# Patient Record
Sex: Female | Born: 1975
Health system: Southern US, Community
[De-identification: ages and names within clinical notes are randomized; demographics above are authoritative.]

## PROBLEM LIST (undated history)

## (undated) DIAGNOSIS — K219 Gastro-esophageal reflux disease without esophagitis: Secondary | ICD-10-CM

## (undated) DIAGNOSIS — J45909 Unspecified asthma, uncomplicated: Secondary | ICD-10-CM

## (undated) DIAGNOSIS — B009 Herpesviral infection, unspecified: Secondary | ICD-10-CM

## (undated) DIAGNOSIS — M549 Dorsalgia, unspecified: Secondary | ICD-10-CM

## (undated) DIAGNOSIS — R51 Headache: Secondary | ICD-10-CM

## (undated) HISTORY — DX: Gastro-esophageal reflux disease without esophagitis: K21.9

## (undated) HISTORY — DX: Headache: R51

## (undated) HISTORY — DX: Unspecified asthma, uncomplicated: J45.909

## (undated) HISTORY — DX: Dorsalgia, unspecified: M54.9

## (undated) HISTORY — DX: Herpesviral infection, unspecified: B00.9

---

## 1998-10-26 ENCOUNTER — Other Ambulatory Visit: Admission: RE | Admit: 1998-10-26 | Discharge: 1998-10-26 | Payer: Self-pay | Admitting: Internal Medicine

## 1999-03-20 ENCOUNTER — Inpatient Hospital Stay (HOSPITAL_COMMUNITY): Admission: AD | Admit: 1999-03-20 | Discharge: 1999-03-26 | Payer: Self-pay | Admitting: *Deleted

## 1999-03-20 ENCOUNTER — Encounter: Payer: Self-pay | Admitting: *Deleted

## 1999-03-28 ENCOUNTER — Emergency Department (HOSPITAL_COMMUNITY): Admission: EM | Admit: 1999-03-28 | Discharge: 1999-03-28 | Payer: Self-pay | Admitting: Emergency Medicine

## 1999-04-05 ENCOUNTER — Encounter: Admission: RE | Admit: 1999-04-05 | Discharge: 1999-04-05 | Payer: Self-pay | Admitting: Obstetrics

## 1999-04-19 ENCOUNTER — Encounter: Admission: RE | Admit: 1999-04-19 | Discharge: 1999-04-19 | Payer: Self-pay | Admitting: Obstetrics

## 1999-04-26 ENCOUNTER — Encounter: Payer: Self-pay | Admitting: Obstetrics

## 1999-04-26 ENCOUNTER — Ambulatory Visit (HOSPITAL_COMMUNITY): Admission: RE | Admit: 1999-04-26 | Discharge: 1999-04-26 | Payer: Self-pay | Admitting: Obstetrics

## 1999-05-31 ENCOUNTER — Encounter: Admission: RE | Admit: 1999-05-31 | Discharge: 1999-05-31 | Payer: Self-pay | Admitting: Obstetrics

## 1999-07-02 ENCOUNTER — Encounter: Payer: Self-pay | Admitting: Obstetrics

## 1999-07-02 ENCOUNTER — Ambulatory Visit (HOSPITAL_COMMUNITY): Admission: RE | Admit: 1999-07-02 | Discharge: 1999-07-02 | Payer: Self-pay | Admitting: Internal Medicine

## 1999-12-17 ENCOUNTER — Other Ambulatory Visit: Admission: RE | Admit: 1999-12-17 | Discharge: 1999-12-17 | Payer: Self-pay | Admitting: Obstetrics and Gynecology

## 2000-03-06 ENCOUNTER — Encounter: Payer: Self-pay | Admitting: Obstetrics and Gynecology

## 2000-03-06 ENCOUNTER — Ambulatory Visit (HOSPITAL_COMMUNITY): Admission: RE | Admit: 2000-03-06 | Discharge: 2000-03-06 | Payer: Self-pay | Admitting: Obstetrics and Gynecology

## 2000-06-07 ENCOUNTER — Inpatient Hospital Stay (HOSPITAL_COMMUNITY): Admission: AD | Admit: 2000-06-07 | Discharge: 2000-06-07 | Payer: Self-pay | Admitting: Obstetrics and Gynecology

## 2000-08-03 ENCOUNTER — Inpatient Hospital Stay (HOSPITAL_COMMUNITY): Admission: AD | Admit: 2000-08-03 | Discharge: 2000-08-06 | Payer: Self-pay | Admitting: Obstetrics and Gynecology

## 2000-08-03 ENCOUNTER — Inpatient Hospital Stay (HOSPITAL_COMMUNITY): Admission: AD | Admit: 2000-08-03 | Discharge: 2000-08-03 | Payer: Self-pay | Admitting: Obstetrics and Gynecology

## 2000-08-09 ENCOUNTER — Encounter: Admission: RE | Admit: 2000-08-09 | Discharge: 2000-09-08 | Payer: Self-pay | Admitting: Obstetrics and Gynecology

## 2000-10-18 ENCOUNTER — Inpatient Hospital Stay (HOSPITAL_COMMUNITY): Admission: AD | Admit: 2000-10-18 | Discharge: 2000-10-18 | Payer: Self-pay | Admitting: Obstetrics and Gynecology

## 2000-12-31 ENCOUNTER — Other Ambulatory Visit: Admission: RE | Admit: 2000-12-31 | Discharge: 2000-12-31 | Payer: Self-pay | Admitting: Obstetrics and Gynecology

## 2005-09-26 ENCOUNTER — Emergency Department (HOSPITAL_COMMUNITY): Admission: EM | Admit: 2005-09-26 | Discharge: 2005-09-26 | Payer: Self-pay | Admitting: Emergency Medicine

## 2008-03-28 ENCOUNTER — Inpatient Hospital Stay (HOSPITAL_COMMUNITY): Admission: AD | Admit: 2008-03-28 | Discharge: 2008-03-28 | Payer: Self-pay | Admitting: Obstetrics & Gynecology

## 2008-04-06 ENCOUNTER — Inpatient Hospital Stay (HOSPITAL_COMMUNITY): Admission: AD | Admit: 2008-04-06 | Discharge: 2008-04-06 | Payer: Self-pay | Admitting: Obstetrics and Gynecology

## 2008-04-22 ENCOUNTER — Inpatient Hospital Stay (HOSPITAL_COMMUNITY): Admission: AD | Admit: 2008-04-22 | Discharge: 2008-04-22 | Payer: Self-pay | Admitting: Obstetrics & Gynecology

## 2008-04-23 ENCOUNTER — Inpatient Hospital Stay (HOSPITAL_COMMUNITY): Admission: AD | Admit: 2008-04-23 | Discharge: 2008-04-25 | Payer: Self-pay | Admitting: Obstetrics & Gynecology

## 2008-07-09 ENCOUNTER — Emergency Department (HOSPITAL_COMMUNITY): Admission: EM | Admit: 2008-07-09 | Discharge: 2008-07-09 | Payer: Self-pay | Admitting: Family Medicine

## 2010-07-03 LAB — CBC
HCT: 34.4 % — ABNORMAL LOW (ref 36.0–46.0)
Hemoglobin: 11.7 g/dL — ABNORMAL LOW (ref 12.0–15.0)
MCHC: 34 g/dL (ref 30.0–36.0)
MCHC: 34 g/dL (ref 30.0–36.0)
MCV: 90.6 fL (ref 78.0–100.0)
MCV: 91.8 fL (ref 78.0–100.0)
Platelets: 214 10*3/uL (ref 150–400)
Platelets: 245 10*3/uL (ref 150–400)
RBC: 3.79 MIL/uL — ABNORMAL LOW (ref 3.87–5.11)
RDW: 13.3 % (ref 11.5–15.5)
RDW: 13.7 % (ref 11.5–15.5)
WBC: 10.6 10*3/uL — ABNORMAL HIGH (ref 4.0–10.5)

## 2010-07-31 NOTE — H&P (Signed)
Tracie Riley, PALOMAREZ               ACCOUNT NO.:  [id number]   MEDICAL RECORD NO.:  [id number]          PATIENT TYPE:  MAT   LOCATION:  [facility]                          FACILITY:  [facility]   PHYSICIAN:  Lenoard Aden, M.D.DATE OF BIRTH:  Sep 17, 1975   DATE OF ADMISSION:  03/28/2008  DATE OF DISCHARGE:  03/28/2008                              HISTORY & PHYSICAL   CHIEF COMPLAINT:  Persistent upper respiratory infection.   HISTORY OF PRESENT ILLNESS:  A 35 year old white female G3, P1 at 31-  plus weeks gestation with persistent upper respiratory infection status  post Zithromax.  She denies productive cough or fevers.   ALLERGIES:  She has no known drug allergies.   MEDICATIONS:  Prenatal vitamins and finishing Zithromax.   FAMILY HISTORY:  Hypertension, pancreatic cancer, stroke, kidney  disease, rheumatoid arthritis, bipolar disorder, and pelvic disease.   SOCIAL HISTORY:  She is a light smoker.  She denies domestic or physical  violence.  She has history of one STD and one-term pregnancy with  vaginal delivery.   No other medical or surgical hospitalization.   PHYSICAL EXAMINATION:  GENERAL:  She is a well-developed, well-nourished  white female in no acute distress.  HEENT:  Normal.  LUNGS:  Clear.  HEART:  Regular rate and rhythm.  ABDOMEN:  Soft, gravid, and nontender.  No CVA tenderness.  PELVIC:  Deferred.  EXTREMITIES:  There are no cords.  NEUROLOGIC:  Nonfocal.  SKIN:  Intact.   Chest x-ray is negative.   IMPRESSION:  Upper respiratory infection.  No evidence of progressive  infection.  No evidence of bronchitis or pneumonia.   PLAN:  Discharged home.  Albuterol inhaler is given.  Follow up in the  office as scheduled.      Lenoard Aden, M.D.  Electronically Signed     RJT/MEDQ  D:  03/28/2008  T:  03/29/2008  Job:  [id number]

## 2010-08-03 NOTE — Discharge Summary (Signed)
[facility]  Patient:    Tracie Riley, Tracie Riley                      MRN: [id number] Adm. Date:  [id number] Disc. Date: [id number] Attending:  Michaele Offer                           Discharge Summary  DISCHARGE DIAGNOSES:          1. Intrauterine pregnancy at 39 weeks.                               2. Status post normal spontaneous vaginal                                  delivery.  DISCHARGE MEDICATIONS:        1. Percocet 1-2 tablets p.o. q.4h. p.r.n.                               2. Motrin 600 mg p.o. q.6h.  DISCHARGE FOLLOW-UP:          The patient is to follow up in six weeks for her routine postpartum exam.  HOSPITAL COURSE:              The patient is a 35 year old female, G2, P3-0-0-1-0 who is admitted at 39+ weeks with the complaint of contractions every five minutes.  Her prenatal care had been essentially uncomplicated, just some mild anemia which responded to _____, and carpal tunnel syndrome. On admission, she was 3 cm dilated, 90% effaced, and -1 station.  Prenatal laboratories were as follows:  O-positive, antibody negative, RPR nonreactive, Rubella equivocal, hepatitis B surface antigen negative, HIV negative, GC negative, chlamydia negative, GBS negative.  PAST OBSTETRICAL HISTORY:     In 1996, she had a miscarriage.  PAST GYNECOLOGIC HISTORY:     In 2001, she had a history of PID.  She also had a history of herpes.  However, she had no outbreaks in this pregnancy.  PAST MEDICAL HISTORY:         She had been diagnosed in the past with ADHD and had history of depression.  PAST SURGICAL HISTORY:        None.  MEDICATIONS:                  None.  ALLERGIES:                    She had no known drug allergies.  SOCIAL HISTORY:               She is single with the father of the baby present and involved.  She had a history of cocaine and marijuana use prior to pregnancy.  However, she had very minimal use of tobacco during the  pregnancy.  PHYSICAL EXAMINATION:  VITAL SIGNS:                  On admission, she was afebrile with stable vital signs.  Fetal heart rate was reactive.  She was thus admitted and progressed on her own over the next several hours to 9 cm, complete effacement, and +1 station.  She was vertex with a bulging bag of water.  Approximately two  hours later, the patient was found to be 10 cm and +2 station with, again, a bulging bag of water.  She was allowed to begin pushing and had rupture of membranes with her first push.  She then pushed for approximately two hours with some resting and had a normal spontaneous vaginal delivery of a vigorous female infant over a secondary laceration.  Apgars were 9 and 9.  Weight was 7 pounds and 11 ounces.  The placenta delivered spontaneously and second degree laceration was repaired with 2-0 Vicryl.  Cervix and rectum were intact.  She was then admitted for routine postpartum care and was doing very well on postpartum day #2.  She was afebrile with stable vital signs.  She was breast-feeding and doing well with that.  Social work had met with the patient and discussed risk factors for postpartum depression as well as giving her information for support after her hospitalization.  Therefore, the patient was felt stable for discharge and was discharged to home to follow up in approximately six weeks for her routine postpartum exam. DD:  08/06/00 TD:  08/06/00 Job: [id number]

## 2012-03-04 ENCOUNTER — Emergency Department (HOSPITAL_COMMUNITY)
Admission: EM | Admit: 2012-03-04 | Discharge: 2012-03-04 | Disposition: A | Discharge status: Home / Self Care | Payer: Self-pay | Attending: Emergency Medicine | Admitting: Emergency Medicine

## 2012-03-04 ENCOUNTER — Encounter (HOSPITAL_COMMUNITY): Payer: Self-pay | Admitting: Cardiology

## 2012-03-04 DIAGNOSIS — Z79899 Other long term (current) drug therapy: Secondary | ICD-10-CM | POA: Insufficient documentation

## 2012-03-04 DIAGNOSIS — R21 Rash and other nonspecific skin eruption: Secondary | ICD-10-CM | POA: Insufficient documentation

## 2012-03-04 DIAGNOSIS — L509 Urticaria, unspecified: Secondary | ICD-10-CM | POA: Insufficient documentation

## 2012-03-04 DIAGNOSIS — T7840XA Allergy, unspecified, initial encounter: Secondary | ICD-10-CM

## 2012-03-04 DIAGNOSIS — R220 Localized swelling, mass and lump, head: Secondary | ICD-10-CM | POA: Insufficient documentation

## 2012-03-04 DIAGNOSIS — F17200 Nicotine dependence, unspecified, uncomplicated: Secondary | ICD-10-CM | POA: Insufficient documentation

## 2012-03-04 MED ORDER — METHYLPREDNISOLONE SODIUM SUCC 125 MG IJ SOLR
125.0000 mg | Freq: Once | INTRAMUSCULAR | Status: AC
  Administered 2012-03-04: 125 mg via INTRAVENOUS
  Filled 2012-03-04: qty 2

## 2012-03-04 MED ORDER — DIPHENHYDRAMINE HCL 50 MG/ML IJ SOLN
25.0000 mg | Freq: Once | INTRAMUSCULAR | Status: AC
  Administered 2012-03-04: 25 mg via INTRAVENOUS
  Filled 2012-03-04: qty 1

## 2012-03-04 MED ORDER — PREDNISONE 20 MG PO TABS: 60.0000 mg | ORAL_TABLET | Freq: Every day | ORAL | Status: AC

## 2012-03-04 MED ORDER — FAMOTIDINE IN NACL 20-0.9 MG/50ML-% IV SOLN
20.0000 mg | Freq: Once | INTRAVENOUS | Status: AC
  Administered 2012-03-04: 20 mg via INTRAVENOUS
  Filled 2012-03-04: qty 50

## 2012-03-04 NOTE — ED Notes (Signed)
Pt reports hives on her arms, neck and chest that started yesterday. Denies using any new medications or foods. Airway intact.

## 2012-03-04 NOTE — ED Provider Notes (Signed)
History     CSN: [id number]  Arrival date & time 03/04/12  4098   First MD Initiated Contact with Patient 03/04/12 [phone]      Chief Complaint  Patient presents with  . Allergic Reaction    (Consider location/radiation/quality/duration/timing/severity/associated sxs/prior treatment) HPI Tracie Riley is a 36 y.o. female who presents with complaint of possible allergic reaction. States yesterday around 2pm after she woke up from a nap, began itching, saw some red rash over her neck. States rash quickly spread, and by evening time was all over her body. States it subsided and again worsened this morning. Pt did not take any medication. Rash is all over, itchy, hives. States currently her lips feel "funny." Denies swelling of the throat or difficulty breathing. Stats no new products or foods that she can think of. Hx of similar reaction, states was much worse last time, received epi, never figured out what she was allergic to.   History reviewed. No pertinent past medical history.  History reviewed. No pertinent past surgical history.  History reviewed. No pertinent family history.  History  Substance Use Topics  . Smoking status: Current Every Day Smoker  . Smokeless tobacco: Not on file  . Alcohol Use: Yes    OB History    Grav Para Term Preterm Abortions TAB SAB Ect Mult Living                  Review of Systems  HENT: Positive for facial swelling.   Respiratory: Negative for shortness of breath.   Skin: Positive for rash.  All other systems reviewed and are negative.    Allergies  Review of patient's allergies indicates no known allergies.  Home Medications   Current Outpatient Rx  Name  Route  Sig  Dispense  Refill  . BEE POLLEN PO   Oral   Take 1 capsule by mouth daily.         Marland Kitchen GARCINIA CAMBOGIA-CHROMIUM PO   Oral   Take 1 capsule by mouth daily.         Marland Kitchen HYDROCORTISONE 1 % EX CREA   Topical   Apply 1 application topically 2 (two) times daily  as needed. For itching           BP 128/86  Pulse 110  Temp 97.5 F (36.4 C) (Oral)  Resp 20  SpO2 100%  Physical Exam  Nursing note and vitals reviewed. Constitutional: She is oriented to person, place, and time. She appears well-developed and well-nourished. No distress.  HENT:       No rash over oral mucosa. Lips appear normal, tongue normal, uvula normal  Eyes: Conjunctivae normal are normal.  Neck: Neck supple.  Cardiovascular: Normal rate, regular rhythm and normal heart sounds.   Pulmonary/Chest: Effort normal and breath sounds normal. No respiratory distress. She has no wheezes. She has no rales.       No stridor  Abdominal: Soft. Bowel sounds are normal. She exhibits no distension. There is no tenderness. There is no rebound.  Musculoskeletal: She exhibits no edema.  Neurological: She is alert and oriented to person, place, and time.  Skin:       Diffuse hives all over the body, including scalp, hands, arms, back, abdomen, neck, legs.   Psychiatric: She has a normal mood and affect.    ED Course  Procedures (including critical care time)  9:45 AM Pt seen and examined. Pt with diffuse hives and feels like lips swelling. Normal lips, tongue,  uvula on exam. No stridor. Will start IV, solumedrol, pepcid, benadryl, will monitor.   11:06 AM PT reassessed. Rash still there, states itching is not as bad. No worsening in lip swelling, no difficulty breathing. Will continue to monitor. Pt moved to CDU   1. Allergic reaction   2. Hives       MDM         Lottie Mussel, PA 03/04/12 1652

## 2012-03-04 NOTE — ED Provider Notes (Signed)
Moved to CDU to observe after trx for hives. She is feeling improved, no rash now. Denies SOB or throat swelling now or at symptom onset. Stable for discharge home.  Rodena Medin, PA-C 03/04/12 1243

## 2012-03-05 NOTE — ED Provider Notes (Signed)
Medical screening examination/treatment/procedure(s) were performed by non-physician practitioner and as supervising physician I was immediately available for consultation/collaboration.  Lc Joynt L Rilen Shukla, MD 03/05/12 0825 

## 2012-03-05 NOTE — ED Provider Notes (Signed)
Medical screening examination/treatment/procedure(s) were performed by non-physician practitioner and as supervising physician I was immediately available for consultation/collaboration.  Flint Melter, MD 03/05/12 [phone]

## 2013-01-07 ENCOUNTER — Encounter: Payer: Self-pay | Admitting: Obstetrics & Gynecology

## 2013-01-07 ENCOUNTER — Ambulatory Visit (INDEPENDENT_AMBULATORY_CARE_PROVIDER_SITE_OTHER): Payer: Self-pay | Admitting: Obstetrics & Gynecology

## 2013-01-07 VITALS — BP 114/65 | HR 84 | Temp 97.5°F | Ht 67.0 in | Wt 193.4 lb

## 2013-01-07 DIAGNOSIS — N951 Menopausal and female climacteric states: Secondary | ICD-10-CM

## 2013-01-07 DIAGNOSIS — Z30431 Encounter for routine checking of intrauterine contraceptive device: Secondary | ICD-10-CM

## 2013-01-07 DIAGNOSIS — Z01419 Encounter for gynecological examination (general) (routine) without abnormal findings: Secondary | ICD-10-CM

## 2013-01-07 NOTE — Progress Notes (Signed)
Patient ID: Tracie Riley, female   DOB: 1975-09-14, 37 y.o.   MRN: [id number]  Chief Complaint  Patient presents with  . Gynecologic Exam  . IUD check    HPI Tracie Riley is a 37 y.o. female.  No LMP recorded. Patient is not currently having periods (Reason: IUD). [id number] Mirena for 4 yr and 8 months. Occasional spotting, has VMS HPI  History reviewed. No pertinent past medical history.  History reviewed. No pertinent past surgical history. D&C for miscarriage Family History  Problem Relation Age of Onset  . Rheum arthritis Mother   . Hepatitis C Mother    Early menopause 27 yr, mother Social History History  Substance Use Topics  . Smoking status: Current Every Day Smoker -- 0.50 packs/day    Types: Cigarettes  . Smokeless tobacco: Never Used  . Alcohol Use: Yes     Comment: occasional    No Known Allergies  No current outpatient prescriptions on file.   No current facility-administered medications for this visit.    Review of Systems Review of Systems  Constitutional: Negative for fatigue.       Hot flushes  Genitourinary: Positive for dyspareunia. Negative for vaginal bleeding, vaginal discharge and menstrual problem.  Psychiatric/Behavioral:       Decreased libido    Blood pressure 114/65, pulse 84, temperature 97.5 F (36.4 C), height 5\' 7"  (1.702 m), weight 193 lb 6.4 oz (87.726 kg).  Physical Exam Physical Exam  Constitutional: She is oriented to person, place, and time. She appears well-developed. No distress.  Pulmonary/Chest: Effort normal. No respiratory distress.  Breasts without mass or tenderness  Abdominal: Soft. Bowel sounds are normal. There is no tenderness.  Genitourinary: Vagina normal and uterus normal. No vaginal discharge found.  Pap doneStrings visible no mass  Neurological: She is alert and oriented to person, place, and time.  Skin: Skin is warm and dry.  Psychiatric: She has a normal mood and affect. Her behavior is normal.     Data Reviewed   Assessment    Possible perimenopause, IUD in place     Plan    [facility], consider E2. RTC.        Kamarrion Stfort 01/07/2013, 1:32 PM

## 2013-01-07 NOTE — Patient Instructions (Signed)
Levonorgestrel intrauterine device (IUD) What is this medicine? LEVONORGESTREL IUD (LEE voe nor jes trel) is a contraceptive (birth control) device. The device is placed inside the uterus by a healthcare professional. It is used to prevent pregnancy and can also be used to treat heavy bleeding that occurs during your period. Depending on the device, it can be used for 3 to 5 years. This medicine may be used for other purposes; ask your health care provider or pharmacist if you have questions. What should I tell my health care provider before I take this medicine? They need to know if you have any of these conditions: -abnormal Pap smear -cancer of the breast, uterus, or cervix -diabetes -endometritis -genital or pelvic infection now or in the past -have more than one sexual partner or your partner has more than one partner -heart disease -history of an ectopic or tubal pregnancy -immune system problems -IUD in place -liver disease or tumor -problems with blood clots or take blood-thinners -use intravenous drugs -uterus of unusual shape -vaginal bleeding that has not been explained -an unusual or allergic reaction to levonorgestrel, other hormones, silicone, or polyethylene, medicines, foods, dyes, or preservatives -pregnant or trying to get pregnant -breast-feeding How should I use this medicine? This device is placed inside the uterus by a health care professional. Talk to your pediatrician regarding the use of this medicine in children. Special care may be needed. Overdosage: If you think you have taken too much of this medicine contact a poison control center or emergency room at once. NOTE: This medicine is only for you. Do not share this medicine with others. What if I miss a dose? This does not apply. What may interact with this medicine? Do not take this medicine with any of the following medications: -amprenavir -bosentan -fosamprenavir This medicine may also interact with  the following medications: -aprepitant -barbiturate medicines for inducing sleep or treating seizures -bexarotene -griseofulvin -medicines to treat seizures like carbamazepine, ethotoin, felbamate, oxcarbazepine, phenytoin, topiramate -modafinil -pioglitazone -rifabutin -rifampin -rifapentine -some medicines to treat HIV infection like atazanavir, indinavir, lopinavir, nelfinavir, tipranavir, ritonavir -St. John's wort -warfarin This list may not describe all possible interactions. Give your health care provider a list of all the medicines, herbs, non-prescription drugs, or dietary supplements you use. Also tell them if you smoke, drink alcohol, or use illegal drugs. Some items may interact with your medicine. What should I watch for while using this medicine? Visit your doctor or health care professional for regular check ups. See your doctor if you or your partner has sexual contact with others, becomes HIV positive, or gets a sexual transmitted disease. This product does not protect you against HIV infection (AIDS) or other sexually transmitted diseases. You can check the placement of the IUD yourself by reaching up to the top of your vagina with clean fingers to feel the threads. Do not pull on the threads. It is a good habit to check placement after each menstrual period. Call your doctor right away if you feel more of the IUD than just the threads or if you cannot feel the threads at all. The IUD may come out by itself. You may become pregnant if the device comes out. If you notice that the IUD has come out use a backup birth control method like condoms and call your health care provider. Using tampons will not change the position of the IUD and are okay to use during your period. What side effects may I notice from receiving this medicine?   Side effects that you should report to your doctor or health care professional as soon as possible: -allergic reactions like skin rash, itching or  hives, swelling of the face, lips, or tongue -fever, flu-like symptoms -genital sores -high blood pressure -no menstrual period for 6 weeks during use -pain, swelling, warmth in the leg -pelvic pain or tenderness -severe or sudden headache -signs of pregnancy -stomach cramping -sudden shortness of breath -trouble with balance, talking, or walking -unusual vaginal bleeding, discharge -yellowing of the eyes or skin Side effects that usually do not require medical attention (report to your doctor or health care professional if they continue or are bothersome): -acne -breast pain -change in sex drive or performance -changes in weight -cramping, dizziness, or faintness while the device is being inserted -headache -irregular menstrual bleeding within first 3 to 6 months of use -nausea This list may not describe all possible side effects. Call your doctor for medical advice about side effects. You may report side effects to FDA at 1-800-FDA-1088. Where should I keep my medicine? This does not apply. NOTE: This sheet is a summary. It may not cover all possible information. If you have questions about this medicine, talk to your doctor, pharmacist, or health care provider.  2013, Elsevier/Gold Standard. (04/04/2011 1:54:04 PM)  

## 2013-01-08 LAB — FOLLICLE STIMULATING HORMONE: FSH: 3.3 m[IU]/mL

## 2014-01-17 ENCOUNTER — Encounter: Payer: Self-pay | Admitting: Obstetrics & Gynecology

## 2014-05-19 ENCOUNTER — Telehealth: Payer: Self-pay | Admitting: Family Medicine

## 2014-05-19 ENCOUNTER — Ambulatory Visit (INDEPENDENT_AMBULATORY_CARE_PROVIDER_SITE_OTHER): Payer: BLUE CROSS/BLUE SHIELD | Admitting: Family Medicine

## 2014-05-19 ENCOUNTER — Encounter: Payer: Self-pay | Admitting: Family Medicine

## 2014-05-19 VITALS — BP 102/70 | HR 80 | Temp 98.3°F | Ht 66.75 in | Wt 205.1 lb

## 2014-05-19 DIAGNOSIS — R51 Headache: Secondary | ICD-10-CM

## 2014-05-19 DIAGNOSIS — Z716 Tobacco abuse counseling: Secondary | ICD-10-CM

## 2014-05-19 DIAGNOSIS — E669 Obesity, unspecified: Secondary | ICD-10-CM | POA: Insufficient documentation

## 2014-05-19 DIAGNOSIS — Z720 Tobacco use: Secondary | ICD-10-CM

## 2014-05-19 DIAGNOSIS — F17200 Nicotine dependence, unspecified, uncomplicated: Secondary | ICD-10-CM

## 2014-05-19 DIAGNOSIS — M549 Dorsalgia, unspecified: Secondary | ICD-10-CM

## 2014-05-19 DIAGNOSIS — R519 Headache, unspecified: Secondary | ICD-10-CM | POA: Insufficient documentation

## 2014-05-19 DIAGNOSIS — Z23 Encounter for immunization: Secondary | ICD-10-CM | POA: Diagnosis not present

## 2014-05-19 HISTORY — DX: Headache, unspecified: R51.9

## 2014-05-19 HISTORY — DX: Dorsalgia, unspecified: M54.9

## 2014-05-19 NOTE — Progress Notes (Signed)
HPI:  Tracie Riley is here to establish care. She wants to get regular health maintenance exam set up. Last PCP and physical: Sees Dr. Dellis Filbert in [place] for routine yearly physicals and mirena.   Has the following chronic problems that require follow up and concerns today:  Chronic Neck and Back Pain: -for many years -sees ortho and has had eval for rheumatological disease  -intermittent flares of R low back pain that last for a few days - flare for last 5 days, moderate pain in R low back worse with certain movements -cause by house cleaning and home improvement -she wants to start getting in shape -denies: weakness, numbness, bowel or bladder incontinence, malaise, weight loss, fevers -hx of CTS and has braces for this but does not   Tobacco Use: -1/2 ppd -working on cutting back -does not like that it stinks and the task of doing it -but craves cigarettes -reports is not ready to quit  ROS negative for unless reported above: fevers, unintentional weight loss, hearing or vision loss, chest pain, palpitations, struggling to breath, hemoptysis, melena, hematochezia, hematuria, falls, loc, si, thoughts of self harm  Past Medical History  Diagnosis Date  . Asthma   . Back pain  - chornic neck and back pain, sees ortho ([facility]), had neg rheum workup per her report 05/19/2014  . Frequent headaches 05/19/2014    History reviewed. No pertinent past surgical history.  Family History  Problem Relation Age of Onset  . Rheum arthritis Mother   . Hepatitis C Mother   . Alcohol abuse Mother   . Alcohol abuse Father     deceased    History   Social History  . Marital Status: Single    Spouse Name: N/A  . Number of Children: N/A  . Years of Education: N/A   Social History Main Topics  . Smoking status: Current Every Day Smoker -- 0.50 packs/day    Types: Cigarettes  . Smokeless tobacco: Never Used  . Alcohol Use: Yes     Comment: occasional  . Drug Use: No  . Sexual  Activity: Yes    Birth Control/ Protection: IUD   Other Topics Concern  . 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  BEE POLLEN PO, Take by mouth., Disp: , Rfl:  .  levonorgestrel (MIRENA) 20 MCG/24HR IUD, 1 each by Intrauterine route once., Disp: , Rfl:  .  Vitamin D, Ergocalciferol, (DRISDOL) 50000 UNITS CAPS capsule, , Disp: , Rfl: 0  EXAM:  Filed Vitals:   05/19/14 1122  BP: 102/70  Pulse: 80  Temp: 98.3 F (36.8 C)    Body mass index is 32.38 kg/(m^2).  GENERAL: vitals reviewed and listed above, alert, oriented, appears well hydrated and in no acute distress  HEENT: atraumatic, conjunttiva clear, no obvious abnormalities on inspection of external nose and ears  NECK: no obvious masses on inspection  LUNGS: clear to auscultation bilaterally, no wheezes, rales or rhonchi, good air movement  CV: HRRR, no peripheral edema  MS: moves all extremities without noticeable abnormality Normal  Gait Normal inspection of back and neck, no obvious scoliosis or leg length descrepancy No bony TTP Soft tissue TTP at: R QL muscle and bilat trap muscles -/+ tests: neg trendelenburg,-facet loading, -SLRT, -CLRT, -FABER, -FADIR Normal muscle strength, sensation to light touch and DTRs in LEs and UE bilaterally Noraml ROM of head and neck, normal cap refill and distal pulses  PSYCH: pleasant and cooperative, no obvious depression or anxiety  ASSESSMENT AND PLAN:  Discussed the following assessment and plan:  Bilateral back pain, unspecified location - likely musculoskeletal given chronicity and intermittent nature, posture at work likely contributing -oped for trial  HEP and advised to follow up with ortho or me if not improving/resolving in 1 month  Frequent headaches  Tobacco use disorder -counseled for >3<10 minutes, advised of significant risks associated with smokgin, advised to quit and offered help - quit line info provided as she is not ready to quit now  Obesity -lifestyle recs - healthy diet and regular exercise  -We reviewed the PMH, PSH, FH, SH, Meds and Allergies. -We provided refills for any medications we will prescribe as needed. -We addressed current concerns per orders and patient instructions. -We have asked for records for pertinent exams, studies, vaccines and notes from previous providers. -We have advised patient to follow up per instructions below.   -Patient advised to return or notify a doctor immediately if symptoms worsen or persist or new concerns arise.  Patient Instructions  BEFORE YOU LEAVE: -Tdap  -schedule physical in 3 months - come fasting that day and we will do basic labs -low back and neck exercises  We recommend the following healthy lifestyle measures: - eat a healthy diet consisting of lots of vegetables, fruits, beans, nuts, seeds, healthy meats such as white chicken and fish and whole grains.  - avoid fried foods, fast food, processed foods, sodas, red meet and other fattening foods.  - get a least 150 minutes of aerobic exercise per week.   Quit smoking - let us know how we can help you with this - call quitline, call for appointment if you decide to quit  Do the exercises at least 3-4 days per week       Birl Lobello R.

## 2014-05-19 NOTE — Addendum Note (Signed)
Addended by: Agnes Lawrence on: 05/19/2014 12:20 PM   Modules accepted: Orders

## 2014-05-19 NOTE — Progress Notes (Signed)
Pre visit review using our clinic review tool, if applicable. No additional management support is needed unless otherwise documented below in the visit note. 

## 2014-05-19 NOTE — Patient Instructions (Addendum)
BEFORE YOU LEAVE: -Tdap  -schedule physical in 3 months - come fasting that day and we will do basic labs -low back and neck exercises  We recommend the following healthy lifestyle measures: - eat a healthy diet consisting of lots of vegetables, fruits, beans, nuts, seeds, healthy meats such as white chicken and fish and whole grains.  - avoid fried foods, fast food, processed foods, sodas, red meet and other fattening foods.  - get a least 150 minutes of aerobic exercise per week.   Quit smoking - let us know how we can help you with this - call quitline, call for appointment if you decide to quit  Do the exercises at least 3-4 days per week

## 2014-05-19 NOTE — Telephone Encounter (Signed)
emmi emailed °

## 2014-05-25 ENCOUNTER — Encounter: Payer: Self-pay | Admitting: Internal Medicine

## 2014-05-25 ENCOUNTER — Ambulatory Visit (INDEPENDENT_AMBULATORY_CARE_PROVIDER_SITE_OTHER): Payer: BLUE CROSS/BLUE SHIELD | Admitting: Internal Medicine

## 2014-05-25 VITALS — BP 120/80 | HR 101 | Temp 98.5°F | Resp 20 | Ht 66.75 in | Wt 206.0 lb

## 2014-05-25 DIAGNOSIS — B9789 Other viral agents as the cause of diseases classified elsewhere: Secondary | ICD-10-CM

## 2014-05-25 DIAGNOSIS — J069 Acute upper respiratory infection, unspecified: Secondary | ICD-10-CM

## 2014-05-25 DIAGNOSIS — F17200 Nicotine dependence, unspecified, uncomplicated: Secondary | ICD-10-CM

## 2014-05-25 DIAGNOSIS — Z720 Tobacco use: Secondary | ICD-10-CM

## 2014-05-25 MED ORDER — HYDROCODONE-ACETAMINOPHEN 5-325 MG PO TABS: 1.0000 | ORAL_TABLET | Freq: Four times a day (QID) | ORAL | Status: DC | PRN

## 2014-05-25 NOTE — Progress Notes (Signed)
Subjective:    Patient ID: Tracie Riley, female    DOB: 04-24-1975, 39 y.o.   MRN: [id number]  HPI 39 year old patient.  Tobacco user who presents with a three-day history of fever, cough, sore throat, sinus and chest congestion and generalized myalgias.  She did have a flu vaccine earlier in the season.  She also complains of some mild headaches.  She has been using Tylenol, which has been helpful for fever.  No sputum production.  She does complain of some mild intermittent wheezing.  Past Medical History  Diagnosis Date  . Asthma   . Back pain  - chornic neck and back pain, sees ortho ([facility]), had neg rheum workup per her report 05/19/2014  . Frequent headaches 05/19/2014    History   Social History  . Marital Status: Single    Spouse Name: N/A  . Number of Children: N/A  . Years of Education: N/A   Occupational History  . Not on file.   Social History Main Topics  . Smoking status: Current Every Day Smoker -- 0.50 packs/day    Types: Cigarettes  . Smokeless tobacco: Never Used  . Alcohol Use: Yes     Comment: occasional  . Drug Use: No  . Sexual Activity: Yes    Birth Control/ Protection: IUD   Other Topics Concern  . 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  Family History  Problem Relation Age of Onset  . Rheum arthritis Mother   . Hepatitis C Mother   . Alcohol abuse Mother   . Alcohol abuse Father     deceased    No Known Allergies  Current Outpatient Prescriptions on File Prior to Visit  Medication Sig Dispense Refill  . BEE POLLEN PO Take by mouth.    . levonorgestrel (MIRENA) 20 MCG/24HR IUD 1 each by Intrauterine route once.      . Vitamin D, Ergocalciferol, (DRISDOL) 50000 UNITS CAPS capsule   0   No current facility-administered medications on file prior to visit.    BP 120/80 mmHg  Pulse 101  Temp(Src) 98.5 F (36.9 C) (Oral)  Resp 20  Ht 5' 6.75" (1.695 m)  Wt 206 lb (93.441 kg)  BMI 32.52 kg/m2  SpO2 98%  LMP 05/17/2014      Review of Systems  Constitutional: Positive for fever, activity change, appetite change and fatigue. Negative for chills.  HENT: Positive for congestion, sinus pressure and sore throat. Negative for dental problem, hearing loss, rhinorrhea and tinnitus.   Eyes: Negative for pain, discharge and visual disturbance.  Respiratory: Positive for cough and wheezing. Negative for shortness of breath.   Cardiovascular: Negative for chest pain, palpitations and leg swelling.  Gastrointestinal: Negative for nausea, vomiting, abdominal pain, diarrhea, constipation, blood in stool and abdominal distention.  Genitourinary: Negative for dysuria, urgency, frequency, hematuria, flank pain, vaginal bleeding, vaginal discharge, difficulty urinating, vaginal pain and pelvic pain.  Musculoskeletal: Negative for joint swelling, arthralgias and gait problem.  Skin: Negative for rash.  Neurological: Negative for dizziness, syncope, speech difficulty, weakness,  numbness and headaches.  Hematological: Negative for adenopathy.  Psychiatric/Behavioral: Negative for behavioral problems, dysphoric mood and agitation. The patient is not nervous/anxious.        Objective:   Physical Exam  Constitutional: She is oriented to person, place, and time. She appears well-developed and well-nourished.  HENT:  Head: Normocephalic.  Right Ear: External ear normal.  Left Ear: External ear normal.  Mouth/Throat: Oropharynx is clear and moist.  Eyes: Conjunctivae and EOM are normal. Pupils are equal, round, and reactive to light.  Neck: Normal range of motion. Neck supple. No thyromegaly present.  Cardiovascular:  Normal rate, regular rhythm, normal heart sounds and intact distal pulses.   Pulmonary/Chest: Effort normal and breath sounds normal.  Abdominal: Soft. Bowel sounds are normal. She exhibits no mass. There is no tenderness.  Musculoskeletal: Normal range of motion.  Lymphadenopathy:    She has no cervical adenopathy.  Neurological: She is alert and oriented to person, place, and time.  Skin: Skin is warm and dry. No rash noted.  Psychiatric: She has a normal mood and affect. Her behavior is normal.          Assessment & Plan:  Viral/flu syndrome.  Smoking cessation encouraged.  Will treat symptomatically Patient instructions discussed and dispensed

## 2014-05-25 NOTE — Patient Instructions (Signed)
Smoking tobacco is very bad for your health. You should stop smoking immediately.  Acute bronchitis symptoms for less than 10 days are generally not helped by antibiotics.  Take over-the-counter expectorants and cough medications such as  Mucinex DM.  Call if there is no improvement in 5 to 7 days or if  you develop worsening cough, fever, or new symptoms, such as shortness of breath or chest pain.  TREATMENT  Acute bronchitis usually goes away in a couple weeks. Oftentimes, no medical treatment is necessary. Medicines are sometimes given for relief of fever or cough. Antibiotic medicines are usually not needed but may be prescribed in certain situations. In some cases, an inhaler may be recommended to help reduce shortness of breath and control the cough. A cool mist vaporizer may also be used to help thin bronchial secretions and make it easier to clear the chest.   HOME CARE INSTRUCTIONS  Get plenty of rest.  Drink enough fluids to keep your urine clear or pale yellow (unless you have a medical condition that requires fluid restriction). Increasing fluids may help thin your respiratory secretions (sputum) and reduce chest congestion, and it will prevent dehydration.  Take medicines only as directed by your health care provider.   Avoid smoking and secondhand smoke. Exposure to cigarette smoke or irritating chemicals will make bronchitis worse. If you are a smoker, consider using nicotine gum or skin patches to help control withdrawal symptoms. Quitting smoking will help your lungs heal faster.  Reduce the chances of another bout of acute bronchitis by washing your hands frequently, avoiding people with cold symptoms, and trying not to touch your hands to your mouth, nose, or eyes.  Keep all follow-up visits as directed by your health care provider.  SEEK MEDICAL CARE IF:  Your symptoms do not improve after 1 week of treatment.  SEEK IMMEDIATE MEDICAL CARE IF:  You develop an increased fever or  chills.  You have chest pain.  You have severe shortness of breath.  You have bloody sputum.  You develop dehydration.  You faint or repeatedly feel like you are going to pass out.

## 2014-05-25 NOTE — Progress Notes (Signed)
Pre visit review using our clinic review tool, if applicable. No additional management support is needed unless otherwise documented below in the visit note. 

## 2014-08-19 ENCOUNTER — Encounter: Payer: Self-pay | Admitting: Family Medicine

## 2014-08-19 ENCOUNTER — Ambulatory Visit (INDEPENDENT_AMBULATORY_CARE_PROVIDER_SITE_OTHER): Payer: BLUE CROSS/BLUE SHIELD | Admitting: Family Medicine

## 2014-08-19 VITALS — BP 102/80 | HR 80 | Temp 98.2°F | Ht 67.0 in | Wt 204.7 lb

## 2014-08-19 DIAGNOSIS — R05 Cough: Secondary | ICD-10-CM

## 2014-08-19 DIAGNOSIS — Z720 Tobacco use: Secondary | ICD-10-CM | POA: Diagnosis not present

## 2014-08-19 DIAGNOSIS — F17200 Nicotine dependence, unspecified, uncomplicated: Secondary | ICD-10-CM

## 2014-08-19 DIAGNOSIS — Z0000 Encounter for general adult medical examination without abnormal findings: Secondary | ICD-10-CM | POA: Diagnosis not present

## 2014-08-19 DIAGNOSIS — Z6832 Body mass index (BMI) 32.0-32.9, adult: Secondary | ICD-10-CM | POA: Insufficient documentation

## 2014-08-19 DIAGNOSIS — R053 Chronic cough: Secondary | ICD-10-CM

## 2014-08-19 DIAGNOSIS — F39 Unspecified mood [affective] disorder: Secondary | ICD-10-CM | POA: Insufficient documentation

## 2014-08-19 LAB — LIPID PANEL
CHOLESTEROL: 179 mg/dL (ref 0–200)
HDL: 46.7 mg/dL (ref >39.00)
LDL CALC: 111 mg/dL — AB (ref 0–99)
NONHDL: 132.3
TRIGLYCERIDES: 107 mg/dL (ref 0.0–149.0)
Total CHOL/HDL Ratio: 4
VLDL: 21.4 mg/dL (ref 0.0–40.0)

## 2014-08-19 LAB — HEMOGLOBIN A1C: Hgb A1c MFr Bld: 5.2 % (ref 4.6–6.5)

## 2014-08-19 MED ORDER — BUPROPION HCL ER (SR) 150 MG PO TB12: 150.0000 mg | ORAL_TABLET | Freq: Two times a day (BID) | ORAL | Status: DC

## 2014-08-19 NOTE — Progress Notes (Signed)
HPI:  Here for CPE:  -Concerns and/or follow up today:   Tobacco use: -smokes 1/2 ppd -no hemoptysis, sometimes has to clear throat -mom died of lung cancer and she wants to do CXR -she is having some mild depression with her mother recently dying - her mother was a smoker  -she has a persistent cough since bronchitis in march  -Diet: variety of foods, balance and well rounded  -Exercise: no regular exercise - her job will pay for gym membership  -Taking folic acid, vitamin D or calcium:   -Diabetes and Dyslipidemia Screening: FASTING  -Hx of HTN: no  -Vaccines: UTD  -pap history: sees Dr. Dellis Filbert  -FDLMP: IUD, does not usually have periods  -sexual activity: yes, female partner, no new partners  -wants STI testing (Hep C if born [birth year]): no  -FH breast, colon or ovarian ca: see FH Last mammogram: n/a Last colon cancer screening: n/a  Breast Ca Risk Assessment: -sees Dr. Dellis Filbert   -Alcohol, Tobacco, drug use: see social history  Review of Systems - no fevers, unintentional weight loss, vision loss, hearing loss, chest pain, sob, hemoptysis, melena, hematochezia, hematuria, genital discharge, changing or concerning skin lesions, bleeding, bruising, loc, thoughts of self harm or SI  Past Medical History  Diagnosis Date  . Asthma   . Back pain  - chornic neck and back pain, sees ortho ([facility]), had neg rheum workup per her report 05/19/2014  . Frequent headaches 05/19/2014    No past surgical history on file.  Family History  Problem Relation Age of Onset  . Rheum arthritis Mother   . Hepatitis C Mother   . Alcohol abuse Mother   . Alcohol abuse Father     deceased    History   Social History  . Marital Status: Single    Spouse Name: N/A  . Number of Children: N/A  . Years of Education: N/A   Social History Main Topics  . Smoking status: Current Every Day Smoker -- 0.50 packs/day    Types: Cigarettes  . Smokeless tobacco: Never Used  . Alcohol Use:  Yes     Comment: occasional  . Drug Use: No  . Sexual Activity: Yes    Birth Control/ Protection: IUD   Other Topics Concern  . 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  acetaminophen (TYLENOL) 500 MG tablet, Take 500 mg by mouth every 6 (six) hours as needed., Disp: , Rfl:  .  BEE POLLEN PO, Take by mouth., Disp: , Rfl:  .  ibuprofen (ADVIL,MOTRIN) 200 MG tablet, Take 200 mg by mouth every 6 (six) hours as needed., Disp: , Rfl:  .  levonorgestrel (MIRENA) 20 MCG/24HR IUD, 1 each by Intrauterine route once., Disp: , Rfl:  .  Vitamin D, Ergocalciferol, (DRISDOL) 50000 UNITS CAPS capsule, , Disp: , Rfl: 0 .  buPROPion (WELLBUTRIN SR) 150 MG 12 hr tablet, Take 1 tablet (150 mg total) by mouth 2 (two) times daily., Disp: 90 tablet, Rfl: 0  EXAM:  Filed Vitals:   08/19/14 0913  BP: 102/80  Pulse: 80  Temp: 98.2 F (36.8 C)   Body mass index is 32.05 kg/(m^2).  GENERAL: vitals reviewed and listed below, alert, oriented, appears well hydrated and in no acute distress  HEENT: head atraumatic, PERRLA, normal appearance of eyes, ears, nose and mouth. moist mucus membranes.  NECK: supple, no masses or lymphadenopathy  LUNGS: clear to auscultation bilaterally, no rales, rhonchi or wheeze - except for a wheeze in R upper lung fields that doe snot clear with coughing  CV: HRRR, no peripheral edema or cyanosis, normal pedal pulses  BREAST: does with gyn ,declined  ABDOMEN: bowel sounds normal, soft, non tender to palpation, no masses, no rebound or guarding  GU: declined, does with gyn  SKIN: no rash or abnormal lesions  MS: normal gait, moves all extremities  normally  NEURO: CN II-XII grossly intact, normal muscle strength and sensation to light touch on extremities  PSYCH: normal affect, pleasant and cooperative  ASSESSMENT AND PLAN:  Discussed the following assessment and plan:  Visit for preventive health examination - Plan: Lipid Panel, Hemoglobin A1c -Discussed and advised all [place] preventive services health task force level A and B recommendations for age, sex and risks.  -Advised at least 150 minutes of exercise per week and a healthy diet low in saturated fats and sweets and consisting of fresh fruits and vegetables, lean meats such as fish and white chicken and whole grains.  -FASTING labs, studies and vaccines per orders this encounter  Bereavement -supported, wellbutrin may help some, no SI or severe depression  Tobacco use disorder - Plan: buPROPion (WELLBUTRIN SR) 150 MG 12 hr tablet, DG Chest 2 View -counseled 3-10 minutes, she opted to try wellbutrin, rx and risks and other options discussed -follow up in 4-6 weeks  Persistent cough - Plan: DG Chest 2 View -in smoker -will get CXR, advised to quit smoking, close follow up  Obesity: -labs per orders, advised healthy lifestyle    Orders Placed This Encounter  Procedures  . DG Chest 2 View    Standing Status: Future     Number of Occurrences:      Standing Expiration Date: 10/19/2015    Order Specific Question:  Reason for Exam (SYMPTOM  OR DIAGNOSIS REQUIRED)    Answer:  persistent cough, smoker    Order Specific Question:  Is the patient pregnant?    Answer:  No    Order Specific Question:  Preferred imaging location?    Answer:  [facility]  . Lipid Panel  . Hemoglobin A1c    Patient advised to return to clinic immediately if symptoms worsen or persist or new concerns.  Patient Instructions  BEFORE YOU LEAVE: -labs -xray sheet -schedule follow up 4-6 weeks  Go get the chest xray  Start the Wellbutrin once daily today  In one week quit  smoking  Start exercising  Eat healthy  Vitamin D3 [phone] IU daily; Folic folic acid 834HDQ daily  If you wish to see Dr. Glennon Hamilton call the number provided          No Follow-up on file.  Colin Benton R.

## 2014-08-19 NOTE — Progress Notes (Signed)
Pre visit review using our clinic review tool, if applicable. No additional management support is needed unless otherwise documented below in the visit note. 

## 2014-08-19 NOTE — Patient Instructions (Addendum)
BEFORE YOU LEAVE: -labs -xray sheet -schedule follow up 4-6 weeks  Go get the chest xray  Start the Wellbutrin once daily today  In one week quit smoking  Start exercising  Eat healthy  Vitamin D3 [phone] IU daily; Folic folic acid 007MAU daily  If you wish to see Dr. Glennon Hamilton call the number provided

## 2014-09-05 ENCOUNTER — Telehealth: Payer: Self-pay

## 2014-09-05 DIAGNOSIS — F17200 Nicotine dependence, unspecified, uncomplicated: Secondary | ICD-10-CM

## 2014-09-05 MED ORDER — BUPROPION HCL ER (SR) 150 MG PO TB12: 150.0000 mg | ORAL_TABLET | Freq: Two times a day (BID) | ORAL | Status: DC

## 2014-09-05 NOTE — Telephone Encounter (Signed)
[id number] - [place], [place] [address]: buPROPion (WELLBUTRIN SR) 150 MG 12 hr tablet

## 2014-09-05 NOTE — Telephone Encounter (Signed)
Rx done. 

## 2014-09-14 ENCOUNTER — Telehealth: Payer: Self-pay | Admitting: Family Medicine

## 2014-09-14 NOTE — Telephone Encounter (Signed)
Patient informed. 

## 2014-09-14 NOTE — Telephone Encounter (Signed)
Pt needs new rx hydrocodone. Please verify strength of wellbutrin. Pt was thinking she should be taking 125 mg not 150 mg

## 2014-09-14 NOTE — Telephone Encounter (Signed)
Would advise follow up with ortho for chronic back pain. I do not advise hydrocodone for this type of pain. The wellbutrin dose is 150 daily for smoking cessation.

## 2015-04-28 ENCOUNTER — Encounter: Payer: Self-pay | Admitting: Family Medicine

## 2015-04-28 ENCOUNTER — Ambulatory Visit (INDEPENDENT_AMBULATORY_CARE_PROVIDER_SITE_OTHER): Payer: 59 | Admitting: Family Medicine

## 2015-04-28 VITALS — BP 100/62 | HR 80 | Temp 98.6°F | Ht 67.0 in | Wt 206.7 lb

## 2015-04-28 DIAGNOSIS — K529 Noninfective gastroenteritis and colitis, unspecified: Secondary | ICD-10-CM

## 2015-04-28 DIAGNOSIS — F1010 Alcohol abuse, uncomplicated: Secondary | ICD-10-CM | POA: Diagnosis not present

## 2015-04-28 DIAGNOSIS — F17200 Nicotine dependence, unspecified, uncomplicated: Secondary | ICD-10-CM | POA: Diagnosis not present

## 2015-04-28 NOTE — Patient Instructions (Signed)
BEFORE YOU LEAVE: -schedule follow up in 1-2 months - come fasting and will plan to do lab work that day   -We placed a referral for you as discussed. It usually takes about 1-2 weeks to process and schedule this referral. If you have not heard from [place] regarding this appointment in 2 weeks please contact our office.  -Please start the Wellbutrin today and quit smoking in 1 week  -please cut back and try to quit drinking alcohol   We recommend the following healthy lifestyle measures: - eat a healthy whole foods diet consisting of regular small meals composed of vegetables, fruits, beans, nuts, seeds, healthy meats such as white chicken and fish and whole grains.  - avoid sweets, white starchy foods, fried foods, fast food, processed foods, sodas, red meet and other fattening foods.  - get a least 150-300 minutes of aerobic exercise per week.

## 2015-04-28 NOTE — Progress Notes (Signed)
HPI:  Acute visit for:  Diarrhea: -reports started about 1-2 years ago -symptoms: several episodes of watery diarrhea daily - reports never has normal stool, bloating sensation -denies:fevers, malaise, wt loss, hematochezia, melena, vomiting, nausea, heartburn, constipation -reports has been giving plasma several times per week for 3 months and was told has to stop for a little while as has depleted her protein - last time she gave was yesterday - wants to do labs after she stops for awhile -continues to smoke, continues to drink 4-5 glasses of wine several times per week -she is worried about IBD and wants to see GI  ROS: See pertinent positives and negatives per HPI.  Past Medical History  Diagnosis Date  . Asthma   . Back pain  - chornic neck and back pain, sees ortho ([facility]), had neg rheum workup per her report 05/19/2014  . Frequent headaches 05/19/2014    No past surgical history on file.  Family History  Problem Relation Age of Onset  . Rheum arthritis Mother   . Hepatitis C Mother   . Alcohol abuse Mother   . Alcohol abuse Father     deceased    Social History   Social History  . Marital Status: Single    Spouse Name: N/A  . Number of Children: N/A  . Years of Education: N/A   Social History Main Topics  . Smoking status: Current Every Day Smoker -- 0.50 packs/day    Types: Cigarettes  . Smokeless tobacco: Never Used  . Alcohol Use: Yes     Comment: occasional  . Drug Use: No  . Sexual Activity: Yes    Birth Control/ Protection: IUD   Other Topics Concern  . 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  acetaminophen (TYLENOL) 500 MG  tablet, Take 500 mg by mouth every 6 (six) hours as needed., Disp: , Rfl:  .  BEE POLLEN PO, Take by mouth., Disp: , Rfl:  .  buPROPion (WELLBUTRIN SR) 150 MG 12 hr tablet, Take 1 tablet (150 mg total) by mouth 2 (two) times daily., Disp: 60 tablet, Rfl: 0 .  ibuprofen (ADVIL,MOTRIN) 200 MG tablet, Take 200 mg by mouth every 6 (six) hours as needed., Disp: , Rfl:  .  levonorgestrel (MIRENA) 20 MCG/24HR IUD, 1 each by Intrauterine route once., Disp: , Rfl:  .  Vitamin D, Ergocalciferol, (DRISDOL) 50000 UNITS CAPS capsule, , Disp: , Rfl: 0  EXAM:  Filed Vitals:   04/28/15 0828  BP: 100/62  Pulse: 80  Temp: 98.6 F (37 C)    Body mass index is 32.37 kg/(m^2).  GENERAL: vitals reviewed and listed above, alert, oriented, appears well hydrated and in no acute distress  HEENT: atraumatic, conjunttiva clear, no obvious abnormalities on inspection of external nose and ears  NECK: no obvious masses on inspection  LUNGS: clear to auscultation bilaterally, no wheezes, rales or rhonchi, good air movement  CV: HRRR, no peripheral edema  ABD: BS+, soft, NTTP  MS: moves all extremities without noticeable abnormality  PSYCH: pleasant and cooperative, no obvious  depression or anxiety  ASSESSMENT AND PLAN:  Discussed the following assessment and plan:  Chronic diarrhea - Plan: Ambulatory referral to Gastroenterology -we discussed possible serious and likely etiologies, workup and treatment, treatment risks and return precautions - she is worried about IBD, suspect IBS more likely but will refer to GI for evaluation, advised will need lab testing too for LFTs, celiac sprue, etc -of course, we advised [place]  to return or notify a doctor immediately if symptoms worsen or persist or new concerns arise.  Tobacco use disorder -advised to quit, offered help, she still has wellbutrin rx and reports plans to use, advised of risks with tobacco  Alcohol abuse -advised of risks, advised to quit,  offfered help -she feel scan cut back  -will plan on lab work in 1 months after no plasma donation -Patient advised to return or notify a doctor immediately if symptoms worsen or persist or new concerns arise.  Patient Instructions  BEFORE YOU LEAVE: -schedule follow up in 1-2 months - come fasting and will plan to do lab work that day   -We placed a referral for you as discussed. It usually takes about 1-2 weeks to process and schedule this referral. If you have not heard from [place] regarding this appointment in 2 weeks please contact our office.  -Please start the Wellbutrin today and quit smoking in 1 week  -please cut back and try to quit drinking alcohol   We recommend the following healthy lifestyle measures: - eat a healthy whole foods diet consisting of regular small meals composed of vegetables, fruits, beans, nuts, seeds, healthy meats such as white chicken and fish and whole grains.  - avoid sweets, white starchy foods, fried foods, fast food, processed foods, sodas, red meet and other fattening foods.  - get a least 150-300 minutes of aerobic exercise per week.         Colin Benton R.

## 2015-04-28 NOTE — Progress Notes (Signed)
Pre visit review using our clinic review tool, if applicable. No additional management support is needed unless otherwise documented below in the visit note. 

## 2015-06-27 ENCOUNTER — Ambulatory Visit: Payer: 59 | Admitting: Family Medicine

## 2016-07-22 ENCOUNTER — Encounter: Payer: Self-pay | Admitting: Family Medicine

## 2016-07-22 ENCOUNTER — Ambulatory Visit (INDEPENDENT_AMBULATORY_CARE_PROVIDER_SITE_OTHER): Payer: 59 | Admitting: Family Medicine

## 2016-07-22 VITALS — BP 120/70 | HR 67 | Temp 98.7°F | Wt 211.4 lb

## 2016-07-22 DIAGNOSIS — J069 Acute upper respiratory infection, unspecified: Secondary | ICD-10-CM

## 2016-07-22 DIAGNOSIS — R062 Wheezing: Secondary | ICD-10-CM | POA: Diagnosis not present

## 2016-07-22 DIAGNOSIS — B9789 Other viral agents as the cause of diseases classified elsewhere: Secondary | ICD-10-CM

## 2016-07-22 MED ORDER — ALBUTEROL SULFATE HFA 108 (90 BASE) MCG/ACT IN AERS: 2.0000 | INHALATION_SPRAY | RESPIRATORY_TRACT | 1 refills | Status: DC | PRN

## 2016-07-22 MED ORDER — METHYLPREDNISOLONE ACETATE 80 MG/ML IJ SUSP
80.0000 mg | Freq: Once | INTRAMUSCULAR | Status: AC
  Administered 2016-07-22: 80 mg via INTRAMUSCULAR

## 2016-07-22 NOTE — Patient Instructions (Signed)
Follow up for any fever or increased shortness of breath Try to stay out of heavy pollen next few days.

## 2016-07-22 NOTE — Progress Notes (Signed)
Subjective:     Patient ID: Tracie Riley, female   DOB: April 16, 1975, 41 y.o.   MRN: [id number]  HPI Patient is a smoker who is seen with onset 2 days ago of cough, nasal congestion, body aches, and wheezing. She denies any history of asthma (though is listed on her problem list). She has used her daughter's pro-air inhaler with temporary relief of wheezing. Cough mostly nonproductive and occasionally productive of clear sputum. Mild sore throat. Couple of coworkers have had similar symptoms. She denies any associated fever. No skin rash. Mild intermittent headache. No nausea or vomiting.  Past Medical History:  Diagnosis Date  . Asthma   . Back pain  - chornic neck and back pain, sees ortho ([facility]), had neg rheum workup per her report 05/19/2014  . Frequent headaches 05/19/2014   No past surgical history on file.  reports that she has been smoking Cigarettes.  She has been smoking about 0.50 packs per day. She has never used smokeless tobacco. She reports that she drinks alcohol. She reports that she does not use drugs. family history includes Alcohol abuse in her father and mother; Cancer in her mother; Hepatitis C in her mother; Rheum arthritis in her mother. No Known Allergies   Review of Systems  Constitutional: Negative for chills and fever.  HENT: Positive for congestion. Negative for sinus pressure.   Respiratory: Positive for cough, shortness of breath and wheezing.   Cardiovascular: Negative for chest pain.       Objective:   Physical Exam  Constitutional: She appears well-developed and well-nourished.  HENT:  Right Ear: External ear normal.  Left Ear: External ear normal.  Mouth/Throat: Oropharynx is clear and moist.  Neck: Neck supple.  Cardiovascular: Normal rate and regular rhythm.   Pulmonary/Chest: Effort normal. No respiratory distress. She has wheezes. She has no rales.  Lymphadenopathy:    She has no cervical adenopathy.       Assessment:     Wheezing.  Suspect viral URI with reactive airway changes. No fever.. Pulse oximetry 97% and patient in no respiratory distress.    Plan:     -Pro-air inhaler 2 puffs every 4 hours as needed for cough and wheeze -Depo-Medrol 80 mg IM. We gave her option of oral steroid taper versus IM and she preferred the former. -Follow-up immediately for any fever or worsening symptoms -encouraged to stop smoking.  Eulas Post MD [facility]

## 2016-07-22 NOTE — Progress Notes (Signed)
Pre visit review using our clinic review tool, if applicable. No additional management support is needed unless otherwise documented below in the visit note. 

## 2016-07-26 ENCOUNTER — Telehealth: Payer: Self-pay | Admitting: Family Medicine

## 2016-07-26 NOTE — Telephone Encounter (Signed)
Pt calling to see if she should get some kind of oral medication for the severe cough that she still has she had been using a inhaler for the wheezing but still has the cough.  Pharm:  [facility] [address]

## 2016-07-30 NOTE — Telephone Encounter (Signed)
Looks like saw Dr. Elease Hashimoto for this. Would advise follow up if severe cough and/or not improving. Thanks.

## 2016-07-31 NOTE — Telephone Encounter (Signed)
I left message for patient to return phone call.   

## 2017-02-10 ENCOUNTER — Ambulatory Visit (INDEPENDENT_AMBULATORY_CARE_PROVIDER_SITE_OTHER): Payer: BLUE CROSS/BLUE SHIELD | Admitting: Obstetrics & Gynecology

## 2017-02-10 ENCOUNTER — Encounter: Payer: Self-pay | Admitting: Obstetrics & Gynecology

## 2017-02-10 ENCOUNTER — Telehealth: Payer: Self-pay | Admitting: *Deleted

## 2017-02-10 VITALS — BP 126/84 | Ht 67.5 in | Wt 214.0 lb

## 2017-02-10 DIAGNOSIS — Z01419 Encounter for gynecological examination (general) (routine) without abnormal findings: Secondary | ICD-10-CM

## 2017-02-10 DIAGNOSIS — Z6833 Body mass index (BMI) 33.0-33.9, adult: Secondary | ICD-10-CM | POA: Diagnosis not present

## 2017-02-10 DIAGNOSIS — N393 Stress incontinence (female) (male): Secondary | ICD-10-CM | POA: Diagnosis not present

## 2017-02-10 DIAGNOSIS — Z720 Tobacco use: Secondary | ICD-10-CM | POA: Diagnosis not present

## 2017-02-10 DIAGNOSIS — Z1151 Encounter for screening for human papillomavirus (HPV): Secondary | ICD-10-CM

## 2017-02-10 DIAGNOSIS — E6609 Other obesity due to excess calories: Secondary | ICD-10-CM

## 2017-02-10 DIAGNOSIS — Z975 Presence of (intrauterine) contraceptive device: Secondary | ICD-10-CM | POA: Diagnosis not present

## 2017-02-10 NOTE — Telephone Encounter (Signed)
Referral faxed to [facility] they will contact pt to schedule.

## 2017-02-10 NOTE — Patient Instructions (Signed)
1. Encounter for routine gynecological examination with Papanicolaou smear of cervix Normal gynecologic exam.  Pap test with high risk HPV done.  Breast exam normal.  Will schedule screening mammogram.  Will do fasting health labs with family physician Dr. Maudie Mercury.  2. IUD contraception Mirena IUD in good position and well tolerated.  Inserted 3 years ago.  3. SUI (stress urinary incontinence, female) Mild increase in incontinence in spite of doing regular Kegel exercises.  Will refer to physical therapy for pelvic floor strengthening.  If not sufficient will refer for sling procedure.  4. Class 1 obesity due to excess calories without serious comorbidity with body mass index (BMI) of 33.0 to 33.9 in adult Progressive increase in body mass index.  Low calorie low carb diet recommended.  Increase physical activity recommended with aerobic activities 5 times a week and weight lifting every 2 days.  5. Tobacco abuse In the process of weaning with a firm intention to quit.  Tracie Riley, it was a pleasure seeing you today!  I will inform you of your results as soon as available.   Calorie Counting for Weight Loss Calories are units of energy. Your body needs a certain amount of calories from food to keep you going throughout the day. When you eat more calories than your body needs, your body stores the extra calories as fat. When you eat fewer calories than your body needs, your body burns fat to get the energy it needs. Calorie counting means keeping track of how many calories you eat and drink each day. Calorie counting can be helpful if you need to lose weight. If you make sure to eat fewer calories than your body needs, you should lose weight. Ask your health care provider what a healthy weight is for you. For calorie counting to work, you will need to eat the right number of calories in a day in order to lose a healthy amount of weight per week. A dietitian can help you determine how many calories you  need in a day and will give you suggestions on how to reach your calorie goal.  A healthy amount of weight to lose per week is usually 1-2 lb (0.5-0.9 kg). This usually means that your daily calorie intake should be reduced by 500-750 calories.  Eating 1,200 - 1,500 calories per day can help most women lose weight.  Eating 1,500 - 1,800 calories per day can help most men lose weight.  What is my plan? My goal is to have __________ calories per day. If I have this many calories per day, I should lose around __________ pounds per week. What do I need to know about calorie counting? In order to meet your daily calorie goal, you will need to:  Find out how many calories are in each food you would like to eat. Try to do this before you eat.  Decide how much of the food you plan to eat.  Write down what you ate and how many calories it had. Doing this is called keeping a food log.  To successfully lose weight, it is important to balance calorie counting with a healthy lifestyle that includes regular activity. Aim for 150 minutes of moderate exercise (such as walking) or 75 minutes of vigorous exercise (such as running) each week. Where do I find calorie information?  The number of calories in a food can be found on a Nutrition Facts label. If a food does not have a Nutrition Facts label, try to look up  the calories online or ask your dietitian for help. Remember that calories are listed per serving. If you choose to have more than one serving of a food, you will have to multiply the calories per serving by the amount of servings you plan to eat. For example, the label on a package of bread might say that a serving size is 1 slice and that there are 90 calories in a serving. If you eat 1 slice, you will have eaten 90 calories. If you eat 2 slices, you will have eaten 180 calories. How do I keep a food log? Immediately after each meal, record the following information in your food log:  What you  ate. Don't forget to include toppings, sauces, and other extras on the food.  How much you ate. This can be measured in cups, ounces, or number of items.  How many calories each food and drink had.  The total number of calories in the meal.  Keep your food log near you, such as in a small notebook in your pocket, or use a mobile app or website. Some programs will calculate calories for you and show you how many calories you have left for the day to meet your goal. What are some calorie counting tips?  Use your calories on foods and drinks that will fill you up and not leave you hungry: ? Some examples of foods that fill you up are nuts and nut butters, vegetables, lean proteins, and high-fiber foods like whole grains. High-fiber foods are foods with more than 5 g fiber per serving. ? Drinks such as sodas, specialty coffee drinks, alcohol, and juices have a lot of calories, yet do not fill you up.  Eat nutritious foods and avoid empty calories. Empty calories are calories you get from foods or beverages that do not have many vitamins or protein, such as candy, sweets, and soda. It is better to have a nutritious high-calorie food (such as an avocado) than a food with few nutrients (such as a bag of chips).  Know how many calories are in the foods you eat most often. This will help you calculate calorie counts faster.  Pay attention to calories in drinks. Low-calorie drinks include water and unsweetened drinks.  Pay attention to nutrition labels for "low fat" or "fat free" foods. These foods sometimes have the same amount of calories or more calories than the full fat versions. They also often have added sugar, starch, or salt, to make up for flavor that was removed with the fat.  Find a way of tracking calories that works for you. Get creative. Try different apps or programs if writing down calories does not work for you. What are some portion control tips?  Know how many calories are in a  serving. This will help you know how many servings of a certain food you can have.  Use a measuring cup to measure serving sizes. You could also try weighing out portions on a kitchen scale. With time, you will be able to estimate serving sizes for some foods.  Take some time to put servings of different foods on your favorite plates, bowls, and cups so you know what a serving looks like.  Try not to eat straight from a bag or box. Doing this can lead to overeating. Put the amount you would like to eat in a cup or on a plate to make sure you are eating the right portion.  Use smaller plates, glasses, and bowls to prevent  overeating.  Try not to multitask (for example, watch TV or use your computer) while eating. If it is time to eat, sit down at a table and enjoy your food. This will help you to know when you are full. It will also help you to be aware of what you are eating and how much you are eating. What are tips for following this plan? Reading food labels  Check the calorie count compared to the serving size. The serving size may be smaller than what you are used to eating.  Check the source of the calories. Make sure the food you are eating is high in vitamins and protein and low in saturated and trans fats. Shopping  Read nutrition labels while you shop. This will help you make healthy decisions before you decide to purchase your food.  Make a grocery list and stick to it. Cooking  Try to cook your favorite foods in a healthier way. For example, try baking instead of frying.  Use low-fat dairy products. Meal planning  Use more fruits and vegetables. Half of your plate should be fruits and vegetables.  Include lean proteins like poultry and fish. How do I count calories when eating out?  Ask for smaller portion sizes.  Consider sharing an entree and sides instead of getting your own entree.  If you get your own entree, eat only half. Ask for a box at the beginning of your  meal and put the rest of your entree in it so you are not tempted to eat it.  If calories are listed on the menu, choose the lower calorie options.  Choose dishes that include vegetables, fruits, whole grains, low-fat dairy products, and lean protein.  Choose items that are boiled, broiled, grilled, or steamed. Stay away from items that are buttered, battered, fried, or served with cream sauce. Items labeled "crispy" are usually fried, unless stated otherwise.  Choose water, low-fat milk, unsweetened iced tea, or other drinks without added sugar. If you want an alcoholic beverage, choose a lower calorie option such as a glass of wine or light beer.  Ask for dressings, sauces, and syrups on the side. These are usually high in calories, so you should limit the amount you eat.  If you want a salad, choose a garden salad and ask for grilled meats. Avoid extra toppings like bacon, cheese, or fried items. Ask for the dressing on the side, or ask for olive oil and vinegar or lemon to use as dressing.  Estimate how many servings of a food you are given. For example, a serving of cooked rice is  cup or about the size of half a baseball. Knowing serving sizes will help you be aware of how much food you are eating at restaurants. The list below tells you how big or small some common portion sizes are based on everyday objects: ? 1 oz-4 stacked dice. ? 3 oz-1 deck of cards. ? 1 tsp-1 die. ? 1 Tbsp- a ping-pong ball. ? 2 Tbsp-1 ping-pong ball. ?  cup- baseball. ? 1 cup-1 baseball. Summary  Calorie counting means keeping track of how many calories you eat and drink each day. If you eat fewer calories than your body needs, you should lose weight.  A healthy amount of weight to lose per week is usually 1-2 lb (0.5-0.9 kg). This usually means reducing your daily calorie intake by 500-750 calories.  The number of calories in a food can be found on a Nutrition Facts label. If  a food does not have a  Nutrition Facts label, try to look up the calories online or ask your dietitian for help.  Use your calories on foods and drinks that will fill you up, and not on foods and drinks that will leave you hungry.  Use smaller plates, glasses, and bowls to prevent overeating. This information is not intended to replace advice given to you by your health care provider. Make sure you discuss any questions you have with your health care provider. Document Released: 03/04/2005 Document Revised: 02/02/2016 Document Reviewed: 02/02/2016 Elsevier Interactive Patient Education  2017 [place] Maintenance, Female Adopting a healthy lifestyle and getting preventive care can go a long way to promote health and wellness. Talk with your health care provider about what schedule of regular examinations is right for you. This is a good chance for you to check in with your provider about disease prevention and staying healthy. In between checkups, there are plenty of things you can do on your own. Experts have done a lot of research about which lifestyle changes and preventive measures are most likely to keep you healthy. Ask your health care provider for more information. Weight and diet Eat a healthy diet  Be sure to include plenty of vegetables, fruits, low-fat dairy products, and lean protein.  Do not eat a lot of foods high in solid fats, added sugars, or salt.  Get regular exercise. This is one of the most important things you can do for your health. ? Most adults should exercise for at least 150 minutes each week. The exercise should increase your heart rate and make you sweat (moderate-intensity exercise). ? Most adults should also do strengthening exercises at least twice a week. This is in addition to the moderate-intensity exercise.  Maintain a healthy weight  Body mass index (BMI) is a measurement that can be used to identify possible weight problems. It estimates body fat based on height  and weight. Your health care provider can help determine your BMI and help you achieve or maintain a healthy weight.  For females 67 years of age and older: ? A BMI below 18.5 is considered underweight. ? A BMI of 18.5 to 24.9 is normal. ? A BMI of 25 to 29.9 is considered overweight. ? A BMI of 30 and above is considered obese.  Watch levels of cholesterol and blood lipids  You should start having your blood tested for lipids and cholesterol at 40 years of age, then have this test every 5 years.  You may need to have your cholesterol levels checked more often if: ? Your lipid or cholesterol levels are high. ? You are older than 40 years of age. ? You are at high risk for heart disease.  Cancer screening Lung Cancer  Lung cancer screening is recommended for adults 45-48 years old who are at high risk for lung cancer because of a history of smoking.  A yearly low-dose CT scan of the lungs is recommended for people who: ? Currently smoke. ? Have quit within the past 15 years. ? Have at least a 30-pack-year history of smoking. A pack year is smoking an average of one pack of cigarettes a day for 1 year.  Yearly screening should continue until it has been 15 years since you quit.  Yearly screening should stop if you develop a health problem that would prevent you from having lung cancer treatment.  Breast Cancer  Practice breast self-awareness. This means understanding how your breasts normally  appear and feel.  It also means doing regular breast self-exams. Let your health care provider know about any changes, no matter how small.  If you are in your 20s or 30s, you should have a clinical breast exam (CBE) by a health care provider every 1-3 years as part of a regular health exam.  If you are 24 or older, have a CBE every year. Also consider having a breast X-ray (mammogram) every year.  If you have a family history of breast cancer, talk to your health care provider about  genetic screening.  If you are at high risk for breast cancer, talk to your health care provider about having an MRI and a mammogram every year.  Breast cancer gene (BRCA) assessment is recommended for women who have family members with BRCA-related cancers. BRCA-related cancers include: ? Breast. ? Ovarian. ? Tubal. ? Peritoneal cancers.  Results of the assessment will determine the need for genetic counseling and BRCA1 and BRCA2 testing.  Cervical Cancer Your health care provider may recommend that you be screened regularly for cancer of the pelvic organs (ovaries, uterus, and vagina). This screening involves a pelvic examination, including checking for microscopic changes to the surface of your cervix (Pap test). You may be encouraged to have this screening done every 3 years, beginning at age 46.  For women ages 30-65, health care providers may recommend pelvic exams and Pap testing every 3 years, or they may recommend the Pap and pelvic exam, combined with testing for human papilloma virus (HPV), every 5 years. Some types of HPV increase your risk of cervical cancer. Testing for HPV may also be done on women of any age with unclear Pap test results.  Other health care providers may not recommend any screening for nonpregnant women who are considered low risk for pelvic cancer and who do not have symptoms. Ask your health care provider if a screening pelvic exam is right for you.  If you have had past treatment for cervical cancer or a condition that could lead to cancer, you need Pap tests and screening for cancer for at least 20 years after your treatment. If Pap tests have been discontinued, your risk factors (such as having a new sexual partner) need to be reassessed to determine if screening should resume. Some women have medical problems that increase the chance of getting cervical cancer. In these cases, your health care provider may recommend more frequent screening and Pap  tests.  Colorectal Cancer  This type of cancer can be detected and often prevented.  Routine colorectal cancer screening usually begins at 40 years of age and continues through 40 years of age.  Your health care provider may recommend screening at an earlier age if you have risk factors for colon cancer.  Your health care provider may also recommend using home test kits to check for hidden blood in the stool.  A small camera at the end of a tube can be used to examine your colon directly (sigmoidoscopy or colonoscopy). This is done to check for the earliest forms of colorectal cancer.  Routine screening usually begins at age 56.  Direct examination of the colon should be repeated every 5-10 years through 40 years of age. However, you may need to be screened more often if early forms of precancerous polyps or small growths are found.  Skin Cancer  Check your skin from head to toe regularly.  Tell your health care provider about any new moles or changes in moles, especially  if there is a change in a mole's shape or color.  Also tell your health care provider if you have a mole that is larger than the size of a pencil eraser.  Always use sunscreen. Apply sunscreen liberally and repeatedly throughout the day.  Protect yourself by wearing long sleeves, pants, a wide-brimmed hat, and sunglasses whenever you are outside.  Heart disease, diabetes, and high blood pressure  High blood pressure causes heart disease and increases the risk of stroke. High blood pressure is more likely to develop in: ? People who have blood pressure in the high end of the normal range (130-139/85-89 mm Hg). ? People who are overweight or obese. ? People who are African American.  If you are 75-74 years of age, have your blood pressure checked every 3-5 years. If you are 65 years of age or older, have your blood pressure checked every year. You should have your blood pressure measured twice-once when you are at  a hospital or clinic, and once when you are not at a hospital or clinic. Record the average of the two measurements. To check your blood pressure when you are not at a hospital or clinic, you can use: ? An automated blood pressure machine at a pharmacy. ? A home blood pressure monitor.  If you are between 55 years and 71 years old, ask your health care provider if you should take aspirin to prevent strokes.  Have regular diabetes screenings. This involves taking a blood sample to check your fasting blood sugar level. ? If you are at a normal weight and have a low risk for diabetes, have this test once every three years after 40 years of age. ? If you are overweight and have a high risk for diabetes, consider being tested at a younger age or more often. Preventing infection Hepatitis B  If you have a higher risk for hepatitis B, you should be screened for this virus. You are considered at high risk for hepatitis B if: ? You were born in a country where hepatitis B is common. Ask your health care provider which countries are considered high risk. ? Your parents were born in a high-risk country, and you have not been immunized against hepatitis B (hepatitis B vaccine). ? You have HIV or AIDS. ? You use needles to inject street drugs. ? You live with someone who has hepatitis B. ? You have had sex with someone who has hepatitis B. ? You get hemodialysis treatment. ? You take certain medicines for conditions, including cancer, organ transplantation, and autoimmune conditions.  Hepatitis C  Blood testing is recommended for: ? Everyone born from [birth year] through 1964. ? Anyone with known risk factors for hepatitis C.  Sexually transmitted infections (STIs)  You should be screened for sexually transmitted infections (STIs) including gonorrhea and chlamydia if: ? You are sexually active and are younger than 40 years of age. ? You are older than 40 years of age and your health care provider tells  you that you are at risk for this type of infection. ? Your sexual activity has changed since you were last screened and you are at an increased risk for chlamydia or gonorrhea. Ask your health care provider if you are at risk.  If you do not have HIV, but are at risk, it may be recommended that you take a prescription medicine daily to prevent HIV infection. This is called pre-exposure prophylaxis (PrEP). You are considered at risk if: ? You are sexually active  and do not regularly use condoms or know the HIV status of your partner(s). ? You take drugs by injection. ? You are sexually active with a partner who has HIV.  Talk with your health care provider about whether you are at high risk of being infected with HIV. If you choose to begin PrEP, you should first be tested for HIV. You should then be tested every 3 months for as long as you are taking PrEP. Pregnancy  If you are premenopausal and you may become pregnant, ask your health care provider about preconception counseling.  If you may become pregnant, take 400 to 800 micrograms (mcg) of folic acid every day.  If you want to prevent pregnancy, talk to your health care provider about birth control (contraception). Osteoporosis and menopause  Osteoporosis is a disease in which the bones lose minerals and strength with aging. This can result in serious bone fractures. Your risk for osteoporosis can be identified using a bone density scan.  If you are 47 years of age or older, or if you are at risk for osteoporosis and fractures, ask your health care provider if you should be screened.  Ask your health care provider whether you should take a calcium or vitamin D supplement to lower your risk for osteoporosis.  Menopause may have certain physical symptoms and risks.  Hormone replacement therapy may reduce some of these symptoms and risks. Talk to your health care provider about whether hormone replacement therapy is right for  you. Follow these instructions at home:  Schedule regular health, dental, and eye exams.  Stay current with your immunizations.  Do not use any tobacco products including cigarettes, chewing tobacco, or electronic cigarettes.  If you are pregnant, do not drink alcohol.  If you are breastfeeding, limit how much and how often you drink alcohol.  Limit alcohol intake to no more than 1 drink per day for nonpregnant women. One drink equals 12 ounces of beer, 5 ounces of wine, or 1 ounces of hard liquor.  Do not use street drugs.  Do not share needles.  Ask your health care provider for help if you need support or information about quitting drugs.  Tell your health care provider if you often feel depressed.  Tell your health care provider if you have ever been abused or do not feel safe at home. This information is not intended to replace advice given to you by your health care provider. Make sure you discuss any questions you have with your health care provider. Document Released: 09/17/2010 Document Revised: 08/10/2015 Document Reviewed: 12/06/2014 Elsevier Interactive Patient Education  [employer].

## 2017-02-10 NOTE — Telephone Encounter (Signed)
-----   Message from Princess Bruins, MD sent at 02/10/2017 11:19 AM EST ----- Regarding: Refer to Physical Therapist for Pelvic Floor strengthening SUI worsening in spite of Kegel exercises.

## 2017-02-10 NOTE — Addendum Note (Signed)
Addended by: Thurnell Garbe A on: 02/10/2017 11:21 AM   Modules accepted: Orders

## 2017-02-10 NOTE — Progress Notes (Signed)
Tracie Riley 12, 1977 [id number]   History:    40 y.o. G30P2A1L2 Married  RP:  Established patient presenting for annual gyn exam   HPI: Well on Mirena IUD, inserted 3 years ago.  No abnormal bleeding.  No pelvic pain.  No pain with intercourse.  Breasts normal.  Complaint of stress urinary incontinence, very mild with daily activities, but increased with coughing and sneezing, in spite of doing regular Kegel exercises.  Weaning cigarette smoking currently.  Slow progressive increase in body mass index, with body mass index at 33.02 today.  Breasts normal.  Bowel movements normal.  Will do fasting health labs with Dr. Maudie Mercury.  Past medical history,surgical history, family history and social history were all reviewed and documented in the EPIC chart.  Gynecologic History No LMP recorded. Patient is not currently having periods (Reason: IUD). Contraception: IUD Last Pap: 2017. Results were: normal Last mammogram: Will schedule now  Obstetric History OB History  Gravida Para Term Preterm AB Living  3 2 2  0 1 2  SAB TAB Ectopic Multiple Live Births  1 0 0 0      # Outcome Date GA Lbr Len/2nd Weight Sex Delivery Anes PTL Lv  3 SAB           2 Term           1 Term                ROS: A ROS was performed and pertinent positives and negatives are included in the history.  GENERAL: No fevers or chills. HEENT: No change in vision, no earache, sore throat or sinus congestion. NECK: No pain or stiffness. CARDIOVASCULAR: No chest pain or pressure. No palpitations. PULMONARY: No shortness of breath, cough or wheeze. GASTROINTESTINAL: No abdominal pain, nausea, vomiting or diarrhea, melena or bright red blood per rectum. GENITOURINARY: No urinary frequency, urgency, hesitancy or dysuria. MUSCULOSKELETAL: No joint or muscle pain, no back pain, no recent trauma. DERMATOLOGIC: No rash, no itching, no lesions. ENDOCRINE: No polyuria, polydipsia, no heat or cold intolerance. No recent change in  weight. HEMATOLOGICAL: No anemia or easy bruising or bleeding. NEUROLOGIC: No headache, seizures, numbness, tingling or weakness. PSYCHIATRIC: No depression, no loss of interest in normal activity or change in sleep pattern.     Exam:   BP 126/84   Ht 5' 7.5" (1.715 m)   Wt 214 lb (97.1 kg)   BMI 33.02 kg/m   Body mass index is 33.02 kg/m.  General appearance : Well developed well nourished female. No acute distress HEENT: Eyes: no retinal hemorrhage or exudates,  Neck supple, trachea midline, no carotid bruits, no thyroidmegaly Lungs: Clear to auscultation, no rhonchi or wheezes, or rib retractions  Heart: Regular rate and rhythm, no murmurs or gallops Breast:Examined in sitting and supine position were symmetrical in appearance, no palpable masses or tenderness,  no skin retraction, no nipple inversion, no nipple discharge, no skin discoloration, no axillary or supraclavicular lymphadenopathy Abdomen: no palpable masses or tenderness, no rebound or guarding Extremities: no edema or skin discoloration or tenderness  Pelvic: Vulva normal  Bartholin, Urethra, Skene Glands: Within normal limits             Vagina: No gross lesions or discharge  Cervix: No gross lesions or discharge. IUD strings visible.  Pap/HR HPV done.  Uterus  AV, normal size, shape and consistency, non-tender and mobile  Adnexa  Without masses or tenderness  Anus and perineum  normal  Assessment/Plan:  41 y.o. female for annual exam   1. Encounter for routine gynecological examination with Papanicolaou smear of cervix Normal gynecologic exam.  Pap test with high risk HPV done.  Breast exam normal.  Will schedule screening mammogram.  Will do fasting health labs with family physician Dr. Maudie Mercury.  2. IUD contraception Mirena IUD in good position and well tolerated.  Inserted 3 years ago.  3. SUI (stress urinary incontinence, female) Mild increase in incontinence in spite of doing regular Kegel exercises.  Will  refer to physical therapy for pelvic floor strengthening.  If not sufficient will refer for sling procedure.  4. Class 1 obesity due to excess calories without serious comorbidity with body mass index (BMI) of 33.0 to 33.9 in adult Progressive increase in body mass index.  Low calorie low carb diet recommended.  Increase physical activity recommended with aerobic activities 5 times a week and weight lifting every 2 days.  5. Tobacco abuse In the process of weaning with a firm intention to quit.  Counseling on above issues for more than 50% x 10 minutes.  Princess Bruins MD, 10:23 AM 02/10/2017

## 2017-02-12 LAB — PAP, TP IMAGING W/ HPV RNA, RFLX HPV TYPE 16,18/45: HPV DNA High Risk: NOT DETECTED

## 2017-03-05 NOTE — Telephone Encounter (Signed)
[facility] has left several message for pt to call and pt has not returned call. The encounter will be closed.

## 2017-04-01 ENCOUNTER — Other Ambulatory Visit: Payer: Self-pay | Admitting: Obstetrics & Gynecology

## 2017-04-01 DIAGNOSIS — Z1231 Encounter for screening mammogram for malignant neoplasm of breast: Secondary | ICD-10-CM

## 2017-05-10 NOTE — Progress Notes (Signed)
HPI:  Here for CPE: I have not seen her in > 1 year. Due for labs and flu vaccine. She has a number of concerns that she wants to discuss today.  She understands there may be an additional charge, since this is also her physical.  She wants to do her physical and discuss these.  Change in bowels: -This is been going on at least a year, maybe even for several years to some extent -Symptoms include frequent bowel movements that often are loose with some discomfort prior to bowel movements such as gas and bloating -Denies fevers, malaise, vomiting, nausea, diarrhea, melena, hematochezia, unexplained weight loss -She is worried about Crohn's disease and other types of things like this and wants to see a gastroenterologist -She has a history of acid reflux, denies any family history of IBD -Did have a lot of anxiety and mood changes with the passing of her mother, but now doing much better in terms of this  Pain in the feet: -Before several months -Sharp and worse with first few steps in the morning and the plantar fascial region -No history of trauma, weakness, numbness or known injury  Urinary frequency: -Long-standing, seeing her GYN about this -Not doing Kegle's recommended -She is worried about diabetes  History of tobacco use/history of alcohol use: -Currently smoking 8 cigarettes a day, working to cut back -Was drinking about 3 glasses of wine every other day, now rarely drinks and only on the weekends -Does not feel that she needs help with this right now  -Sees gynecologist -Sees dermatologist for skin exams  -Diet: variety of foods, balance and well rounded, larger portion sizes -Exercise: no regular exercise -Taking folic acid, vitamin D or calcium: no -Diabetes and Dyslipidemia Screening: Fasting for labs -Vaccines: see vaccine section Epic -wants to get her flu shot today -pap history: sees Dr. Dellis Filbert - last visit 01/2017 - last pap 1//2018 -FDLMP: see nursing  notes -sexual activity: yes, female partner, no new partners -wants STI testing (Hep C if born 50-65): no -FH breast, colon or ovarian ca: see FH Last mammogram: Scheduled with her gynecologist per her report Last colon cancer screening: Not applicable Breast Ca Risk Assessment: see family history and pt history DEXA (>/= 65): Not applicable  -Alcohol, Tobacco, drug use: see social history  Review of Systems - no fevers, unintentional weight loss, vision loss, hearing loss, chest pain, sob, hemoptysis, melena, hematochezia, hematuria, genital discharge, changing or concerning skin lesions, bleeding, bruising, loc, thoughts of self harm or SI  Past Medical History:  Diagnosis Date  . Asthma   . Back pain  - chornic neck and back pain, sees ortho ([facility]), had neg rheum workup per her report 05/19/2014  . Frequent headaches 05/19/2014    History reviewed. No pertinent surgical history.  Family History  Problem Relation Age of Onset  . Rheum arthritis Mother   . Hepatitis C Mother   . Alcohol abuse Mother   . Cancer Mother        lung, smoker  . Alcohol abuse Father        deceased    Social History   Socioeconomic History  . Marital status: Single    Spouse name: None  . Number of children: None  . Years of education: None  . Highest education level: None  Social Needs  . Financial resource strain: None  . Food insecurity - worry: None  . Food insecurity - inability: None  . Transportation needs - medical: None  .  Transportation needs - non-medical: None  Occupational History  . None  Tobacco Use  . Smoking status: Current Every Day Smoker    Packs/day: 0.50    Types: Cigarettes  . Smokeless tobacco: Never Used  Substance and Sexual Activity  . Alcohol use: Yes    Comment: WINE ON THE WEEKEND  . Drug use: No  . Sexual activity: Yes    Partners: Male    Birth control/protection: IUD    Comment: 1ST intercourse- 17, partners- 9, current partner- 36   Other  Topics Concern  . 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  levonorgestrel (MIRENA) 20 MCG/24HR IUD, 1 each by Intrauterine route once., Disp: , Rfl:   EXAM:  Vitals:   05/12/17 0837  BP: 100/62  Pulse: 73  Temp: 98 F (36.7 C)   Body mass index is 33 kg/m. GENERAL: vitals reviewed and listed below, alert, oriented, appears well hydrated and in no acute distress  HEENT: head atraumatic, PERRLA, normal appearance of eyes, ears, nose and mouth. moist mucus membranes.  NECK: supple, no masses or lymphadenopathy  LUNGS: clear to auscultation bilaterally, no rales, rhonchi or wheeze  CV: HRRR, no peripheral edema or cyanosis, normal pedal pulses  ABDOMEN: bowel sounds normal, soft, non tender to palpation, no masses, no rebound or guarding  GU/BREAST: Declined, does with GYN  SKIN: no rash or abnormal lesions  MS: normal gait, moves all extremities normally  NEURO: normal gait, speech and thought processing grossly intact, muscle tone grossly intact throughout  PSYCH: normal affect, pleasant and cooperative  ASSESSMENT AND PLAN:  Discussed the following assessment and plan:  PREVENTIVE EXAM: -Discussed and advised all [place] preventive services health task force level A and B recommendations for age, sex and risks. -Advised at least 150 minutes of exercise per week and a healthy diet with avoidance of (less then 1 serving per week) processed foods, white starches, red meat, fast foods and sweets and consisting of: * 5-9 servings of fresh fruits and vegetables (not corn or potatoes) *nuts and seeds, beans *olives and olive oil *lean meats such as fish and white  chicken  *whole grains -labs, studies and vaccines per orders this encounter  2. Screening for depression -Negative  3. Change in bowel habit -we discussed possible serious and likely etiologies, workup and treatment, treatment risks and return precautions -after this discussion, Hannahgrace opted for labs per orders, trial off dairy, gastroenterology referral, consideration probiotic -follow up advised 1-2 months -of course, we advised Bobbie  to return or notify a doctor immediately if symptoms worsen or persist or new concerns arise.  4. Plantar fasciitis -[place] sock, stretching, ice -Follow-up 1 month  5. Urinary frequency -Kegel exercises, discussed how to do and frequency/repetition -Check labs per orders  6. Mood disorder ([place]) -Reports mood is currently good -PHQ 9 is negative  7. Obesity: -Advised weight reduction, healthy low sugar diet and regular aerobic exercise   Patient advised to return to clinic immediately if symptoms worsen or persist or new concerns.  Patient Instructions  BEFORE YOU LEAVE: -flu shot -labs -  follow up: 1 - 2 months  For the bowel issues: -labs per orders -placed referral per your request -try two weeks dairy free diet to see if this helps  For the foot pain: -try the [place] sock -ice as needed stretching as we discussed  Please quit smoking. Please work on eating a healthy low sugar diet and get regular exercise.  Do the kegel exercises as we discussed.   We recommend the following healthy lifestyle for LIFE: 1) Small portions. But, make sure to get regular (at least 3 per day), healthy meals and small healthy snacks if needed.  2) Eat a healthy clean diet.   TRY TO EAT: -at least 5-7 servings of low sugar, colorful, and nutrient rich vegetables per day (not corn, potatoes or bananas.) -berries are the best choice if you wish to eat fruit (only eat small amounts if trying to reduce weight)  -lean meets (fish, white  meat of chicken or [place]) -vegan proteins for some meals - beans or tofu, whole grains, nuts and seeds -Replace bad fats with good fats - good fats include: fish, nuts and seeds, canola oil, olive oil -small amounts of low fat or non fat dairy -small amounts of100 % whole grains - check the lables -drink plenty of water  AVOID: -SUGAR, sweets, anything with added sugar, corn syrup or sweeteners - must read labels as even foods advertised as "healthy" often are loaded with sugar -if you must have a sweetener, small amounts of stevia may be best -sweetened beverages and artificially sweetened beverages -simple starches (rice, bread, potatoes, pasta, chips, etc - small amounts of 100% whole grains are ok) -red meat, pork, butter -fried foods, fast food, processed food, excessive dairy, eggs and coconut.  3)Get at least 150 minutes of sweaty aerobic exercise per week.  4)Reduce stress - consider counseling, meditation and relaxation to balance other aspects of your life.   Preventive Care 40-64 Years, Female Preventive care refers to lifestyle choices and visits with your health care provider that can promote health and wellness. What does preventive care include?  A yearly physical exam. This is also called an annual well check.  Dental exams once or twice a year.  Routine eye exams. Ask your health care provider how often you should have your eyes checked.  Personal lifestyle choices, including: ? Daily care of your teeth and gums. ? Regular physical activity. ? Eating a healthy diet. ? Avoiding tobacco and drug use. ? Limiting alcohol use. ? Practicing safe sex. ? Taking low-dose aspirin daily starting at age 26. ? Taking vitamin and mineral supplements as recommended by your health care provider. What happens during an annual well check? The services and screenings done by your health care provider during your annual well check will depend on your age, overall health,  lifestyle risk factors, and family history of disease. Counseling Your health care provider may ask you questions about your:  Alcohol use.  Tobacco use.  Drug use.  Emotional well-being.  Home and relationship well-being.  Sexual activity.  Eating habits.  Work and work Statistician.  Method of birth control.  Menstrual cycle.  Pregnancy history.  Screening You may have the following tests or measurements:  Height, weight, and BMI.  Blood pressure.  Lipid and cholesterol levels. These may be checked every 5 years, or more frequently if you are over 38 years old.  Skin check.  Lung cancer screening. You may have this screening every year starting at age 21 if  you have a 30-pack-year history of smoking and currently smoke or have quit within the past 15 years.  Fecal occult blood test (FOBT) of the stool. You may have this test every year starting at age 10.  Flexible sigmoidoscopy or colonoscopy. You may have a sigmoidoscopy every 5 years or a colonoscopy every 10 years starting at age 20.  Hepatitis C blood test.  Hepatitis B blood test.  Sexually transmitted disease (STD) testing.  Diabetes screening. This is done by checking your blood sugar (glucose) after you have not eaten for a while (fasting). You may have this done every 1-3 years.  Mammogram. This may be done every 1-2 years. Talk to your health care provider about when you should start having regular mammograms. This may depend on whether you have a family history of breast cancer.  BRCA-related cancer screening. This may be done if you have a family history of breast, ovarian, tubal, or peritoneal cancers.  Pelvic exam and Pap test. This may be done every 3 years starting at age 42. Starting at age 61, this may be done every 5 years if you have a Pap test in combination with an HPV test.  Bone density scan. This is done to screen for osteoporosis. You may have this scan if you are at high risk for  osteoporosis.  Discuss your test results, treatment options, and if necessary, the need for more tests with your health care provider. Vaccines Your health care provider may recommend certain vaccines, such as:  Influenza vaccine. This is recommended every year.  Tetanus, diphtheria, and acellular pertussis (Tdap, Td) vaccine. You may need a Td booster every 10 years.  Varicella vaccine. You may need this if you have not been vaccinated.  Zoster vaccine. You may need this after age 50.  Measles, mumps, and rubella (MMR) vaccine. You may need at least one dose of MMR if you were born in 1957 or later. You may also need a second dose.  Pneumococcal 13-valent conjugate (PCV13) vaccine. You may need this if you have certain conditions and were not previously vaccinated.  Pneumococcal polysaccharide (PPSV23) vaccine. You may need one or two doses if you smoke cigarettes or if you have certain conditions.  Meningococcal vaccine. You may need this if you have certain conditions.  Hepatitis A vaccine. You may need this if you have certain conditions or if you travel or work in places where you may be exposed to hepatitis A.  Hepatitis B vaccine. You may need this if you have certain conditions or if you travel or work in places where you may be exposed to hepatitis B.  Haemophilus influenzae type b (Hib) vaccine. You may need this if you have certain conditions.  Talk to your health care provider about which screenings and vaccines you need and how often you need them. This information is not intended to replace advice given to you by your health care provider. Make sure you discuss any questions you have with your health care provider. Document Released: 03/31/2015 Document Revised: 11/22/2015 Document Reviewed: 01/03/2015 Elsevier Interactive Patient Education  2018 [employer].     No Follow-up on file.  Lucretia Kern, DO

## 2017-05-12 ENCOUNTER — Ambulatory Visit
Admission: RE | Admit: 2017-05-12 | Discharge: 2017-05-12 | Disposition: A | Discharge status: Home / Self Care | Payer: 59 | Source: Ambulatory Visit | Attending: Obstetrics & Gynecology | Admitting: Obstetrics & Gynecology

## 2017-05-12 ENCOUNTER — Encounter: Payer: Self-pay | Admitting: Family Medicine

## 2017-05-12 ENCOUNTER — Encounter: Payer: Self-pay | Admitting: Gastroenterology

## 2017-05-12 ENCOUNTER — Ambulatory Visit (INDEPENDENT_AMBULATORY_CARE_PROVIDER_SITE_OTHER): Payer: BLUE CROSS/BLUE SHIELD | Admitting: Family Medicine

## 2017-05-12 VITALS — BP 100/62 | HR 73 | Temp 98.0°F | Ht 67.0 in | Wt 210.7 lb

## 2017-05-12 DIAGNOSIS — Z1331 Encounter for screening for depression: Secondary | ICD-10-CM

## 2017-05-12 DIAGNOSIS — F39 Unspecified mood [affective] disorder: Secondary | ICD-10-CM

## 2017-05-12 DIAGNOSIS — Z23 Encounter for immunization: Secondary | ICD-10-CM

## 2017-05-12 DIAGNOSIS — M722 Plantar fascial fibromatosis: Secondary | ICD-10-CM | POA: Diagnosis not present

## 2017-05-12 DIAGNOSIS — Z1231 Encounter for screening mammogram for malignant neoplasm of breast: Secondary | ICD-10-CM | POA: Diagnosis not present

## 2017-05-12 DIAGNOSIS — Z0000 Encounter for general adult medical examination without abnormal findings: Secondary | ICD-10-CM

## 2017-05-12 DIAGNOSIS — R350 Frequency of micturition: Secondary | ICD-10-CM | POA: Diagnosis not present

## 2017-05-12 DIAGNOSIS — R194 Change in bowel habit: Secondary | ICD-10-CM

## 2017-05-12 DIAGNOSIS — R319 Hematuria, unspecified: Secondary | ICD-10-CM | POA: Diagnosis not present

## 2017-05-12 LAB — POC URINALSYSI DIPSTICK (AUTOMATED)
Bilirubin, UA: NEGATIVE
Glucose, UA: NEGATIVE
KETONES UA: NEGATIVE
LEUKOCYTES UA: NEGATIVE
Nitrite, UA: NEGATIVE
PROTEIN UA: NEGATIVE
Spec Grav, UA: >=1.03 — AB (ref 1.010–1.025)
Urobilinogen, UA: 0.2 E.U./dL
pH, UA: 6 (ref 5.0–8.0)

## 2017-05-12 LAB — COMPREHENSIVE METABOLIC PANEL
ALT: 20 U/L (ref 0–35)
AST: 15 U/L (ref 0–37)
Albumin: 4.1 g/dL (ref 3.5–5.2)
Alkaline Phosphatase: 53 U/L (ref 39–117)
BUN: 13 mg/dL (ref 6–23)
CALCIUM: 9.6 mg/dL (ref 8.4–10.5)
CHLORIDE: 103 meq/L (ref 96–112)
CO2: 28 mEq/L (ref 19–32)
CREATININE: 0.62 mg/dL (ref 0.40–1.20)
GFR: 112.86 mL/min (ref >60.00)
Glucose, Bld: 97 mg/dL (ref 70–99)
Potassium: 4.2 mEq/L (ref 3.5–5.1)
SODIUM: 138 meq/L (ref 135–145)
Total Bilirubin: 0.4 mg/dL (ref 0.2–1.2)
Total Protein: 6.3 g/dL (ref 6.0–8.3)

## 2017-05-12 LAB — HEMOGLOBIN A1C: Hgb A1c MFr Bld: 5.6 % (ref 4.6–6.5)

## 2017-05-12 LAB — CBC
HCT: 39.7 % (ref 36.0–46.0)
Hemoglobin: 13.6 g/dL (ref 12.0–15.0)
MCHC: 34.1 g/dL (ref 30.0–36.0)
MCV: 90.5 fl (ref 78.0–100.0)
Platelets: 255 10*3/uL (ref 150.0–400.0)
RBC: 4.39 Mil/uL (ref 3.87–5.11)
RDW: 12.8 % (ref 11.5–15.5)
WBC: 8 10*3/uL (ref 4.0–10.5)

## 2017-05-12 NOTE — Patient Instructions (Signed)
BEFORE YOU LEAVE: -flu shot -labs -follow up: 1 - 2 months  For the bowel issues: -labs per orders -placed referral per your request -try two weeks dairy free diet to see if this helps  For the foot pain: -try the strassburg sock -ice as needed stretching as we discussed  Please quit smoking. Please work on eating a healthy low sugar diet and get regular exercise.  Do the kegel exercises as we discussed.   We recommend the following healthy lifestyle for LIFE: 1) Small portions. But, make sure to get regular (at least 3 per day), healthy meals and small healthy snacks if needed.  2) Eat a healthy clean diet.   TRY TO EAT: -at least 5-7 servings of low sugar, colorful, and nutrient rich vegetables per day (not corn, potatoes or bananas.) -berries are the best choice if you wish to eat fruit (only eat small amounts if trying to reduce weight)  -lean meets (fish, white meat of chicken or [place]) -vegan proteins for some meals - beans or tofu, whole grains, nuts and seeds -Replace bad fats with good fats - good fats include: fish, nuts and seeds, canola oil, olive oil -small amounts of low fat or non fat dairy -small amounts of100 % whole grains - check the lables -drink plenty of water  AVOID: -SUGAR, sweets, anything with added sugar, corn syrup or sweeteners - must read labels as even foods advertised as "healthy" often are loaded with sugar -if you must have a sweetener, small amounts of stevia may be best -sweetened beverages and artificially sweetened beverages -simple starches (rice, bread, potatoes, pasta, chips, etc - small amounts of 100% whole grains are ok) -red meat, pork, butter -fried foods, fast food, processed food, excessive dairy, eggs and coconut.  3)Get at least 150 minutes of sweaty aerobic exercise per week.  4)Reduce stress - consider counseling, meditation and relaxation to balance other aspects of your life.   Preventive Care 40-64 Years,  Female Preventive care refers to lifestyle choices and visits with your health care provider that can promote health and wellness. What does preventive care include?  A yearly physical exam. This is also called an annual well check.  Dental exams once or twice a year.  Routine eye exams. Ask your health care provider how often you should have your eyes checked.  Personal lifestyle choices, including: ? Daily care of your teeth and gums. ? Regular physical activity. ? Eating a healthy diet. ? Avoiding tobacco and drug use. ? Limiting alcohol use. ? Practicing safe sex. ? Taking low-dose aspirin daily starting at age 21. ? Taking vitamin and mineral supplements as recommended by your health care provider. What happens during an annual well check? The services and screenings done by your health care provider during your annual well check will depend on your age, overall health, lifestyle risk factors, and family history of disease. Counseling Your health care provider may ask you questions about your:  Alcohol use.  Tobacco use.  Drug use.  Emotional well-being.  Home and relationship well-being.  Sexual activity.  Eating habits.  Work and work Statistician.  Method of birth control.  Menstrual cycle.  Pregnancy history.  Screening You may have the following tests or measurements:  Height, weight, and BMI.  Blood pressure.  Lipid and cholesterol levels. These may be checked every 5 years, or more frequently if you are over 9 years old.  Skin check.  Lung cancer screening. You may have this screening every year  starting at age 31 if you have a 30-pack-year history of smoking and currently smoke or have quit within the past 15 years.  Fecal occult blood test (FOBT) of the stool. You may have this test every year starting at age 26.  Flexible sigmoidoscopy or colonoscopy. You may have a sigmoidoscopy every 5 years or a colonoscopy every 10 years starting at age  57.  Hepatitis C blood test.  Hepatitis B blood test.  Sexually transmitted disease (STD) testing.  Diabetes screening. This is done by checking your blood sugar (glucose) after you have not eaten for a while (fasting). You may have this done every 1-3 years.  Mammogram. This may be done every 1-2 years. Talk to your health care provider about when you should start having regular mammograms. This may depend on whether you have a family history of breast cancer.  BRCA-related cancer screening. This may be done if you have a family history of breast, ovarian, tubal, or peritoneal cancers.  Pelvic exam and Pap test. This may be done every 3 years starting at age 21. Starting at age 23, this may be done every 5 years if you have a Pap test in combination with an HPV test.  Bone density scan. This is done to screen for osteoporosis. You may have this scan if you are at high risk for osteoporosis.  Discuss your test results, treatment options, and if necessary, the need for more tests with your health care provider. Vaccines Your health care provider may recommend certain vaccines, such as:  Influenza vaccine. This is recommended every year.  Tetanus, diphtheria, and acellular pertussis (Tdap, Td) vaccine. You may need a Td booster every 10 years.  Varicella vaccine. You may need this if you have not been vaccinated.  Zoster vaccine. You may need this after age 94.  Measles, mumps, and rubella (MMR) vaccine. You may need at least one dose of MMR if you were born in 1957 or later. You may also need a second dose.  Pneumococcal 13-valent conjugate (PCV13) vaccine. You may need this if you have certain conditions and were not previously vaccinated.  Pneumococcal polysaccharide (PPSV23) vaccine. You may need one or two doses if you smoke cigarettes or if you have certain conditions.  Meningococcal vaccine. You may need this if you have certain conditions.  Hepatitis A vaccine. You may  need this if you have certain conditions or if you travel or work in places where you may be exposed to hepatitis A.  Hepatitis B vaccine. You may need this if you have certain conditions or if you travel or work in places where you may be exposed to hepatitis B.  Haemophilus influenzae type b (Hib) vaccine. You may need this if you have certain conditions.  Talk to your health care provider about which screenings and vaccines you need and how often you need them. This information is not intended to replace advice given to you by your health care provider. Make sure you discuss any questions you have with your health care provider. Document Released: 03/31/2015 Document Revised: 11/22/2015 Document Reviewed: 01/03/2015 Elsevier Interactive Patient Education  [employer].

## 2017-05-13 ENCOUNTER — Other Ambulatory Visit: Payer: Self-pay | Admitting: Obstetrics & Gynecology

## 2017-05-13 DIAGNOSIS — R928 Other abnormal and inconclusive findings on diagnostic imaging of breast: Secondary | ICD-10-CM

## 2017-05-13 LAB — CELIAC DISEASE COMPREHENSIVE PANEL WITH REFLEXES
(tTG) Ab, IgA: 1 U/mL
IMMUNOGLOBULIN A: 151 mg/dL (ref 81–463)

## 2017-05-15 ENCOUNTER — Ambulatory Visit
Admission: RE | Admit: 2017-05-15 | Discharge: 2017-05-15 | Disposition: A | Discharge status: Home / Self Care | Payer: BLUE CROSS/BLUE SHIELD | Source: Ambulatory Visit | Attending: Obstetrics & Gynecology | Admitting: Obstetrics & Gynecology

## 2017-05-15 ENCOUNTER — Other Ambulatory Visit: Payer: Self-pay | Admitting: Obstetrics & Gynecology

## 2017-05-15 DIAGNOSIS — R928 Other abnormal and inconclusive findings on diagnostic imaging of breast: Secondary | ICD-10-CM | POA: Diagnosis not present

## 2017-05-15 DIAGNOSIS — N6002 Solitary cyst of left breast: Secondary | ICD-10-CM | POA: Diagnosis not present

## 2017-05-15 DIAGNOSIS — N6310 Unspecified lump in the right breast, unspecified quadrant: Secondary | ICD-10-CM | POA: Diagnosis not present

## 2017-05-15 DIAGNOSIS — N6489 Other specified disorders of breast: Secondary | ICD-10-CM

## 2017-05-16 NOTE — Addendum Note (Signed)
Addended by: Agnes Lawrence on: 05/16/2017 01:23 PM   Modules accepted: Orders

## 2017-05-20 ENCOUNTER — Other Ambulatory Visit: Payer: BLUE CROSS/BLUE SHIELD

## 2017-05-23 ENCOUNTER — Telehealth: Payer: Self-pay | Admitting: *Deleted

## 2017-05-23 NOTE — Telephone Encounter (Signed)
See results note. Tracie Riley      Copied from [place] [id number]. Topic: Inquiry >> May 16, 2017  8:18 AM [name] wrote: Reason for CRM: Patient called stating that she missed a call from [facility] on yesterday. Patient has requested for [name] to call her back at [phone].        Thank You!!!

## 2017-05-23 NOTE — Telephone Encounter (Signed)
See results note. Tracie Riley     Copied from [place] [id number]. Topic: Inquiry >> May 15, 2017  6:28 PM Oliver Pila B wrote: Reason for CRM: pt called b/c she returned a phone call she received @ 5:13pm, contact pt if needed

## 2017-07-01 ENCOUNTER — Ambulatory Visit: Payer: BLUE CROSS/BLUE SHIELD | Admitting: Gastroenterology

## 2017-07-01 ENCOUNTER — Encounter: Payer: Self-pay | Admitting: Gastroenterology

## 2017-07-01 ENCOUNTER — Other Ambulatory Visit (INDEPENDENT_AMBULATORY_CARE_PROVIDER_SITE_OTHER): Payer: BLUE CROSS/BLUE SHIELD

## 2017-07-01 ENCOUNTER — Ambulatory Visit: Payer: BLUE CROSS/BLUE SHIELD | Admitting: Family Medicine

## 2017-07-01 VITALS — BP 126/82 | HR 84 | Ht 67.0 in | Wt 211.4 lb

## 2017-07-01 DIAGNOSIS — R1084 Generalized abdominal pain: Secondary | ICD-10-CM | POA: Diagnosis not present

## 2017-07-01 DIAGNOSIS — R152 Fecal urgency: Secondary | ICD-10-CM | POA: Diagnosis not present

## 2017-07-01 DIAGNOSIS — R197 Diarrhea, unspecified: Secondary | ICD-10-CM

## 2017-07-01 DIAGNOSIS — K219 Gastro-esophageal reflux disease without esophagitis: Secondary | ICD-10-CM | POA: Diagnosis not present

## 2017-07-01 DIAGNOSIS — Z0289 Encounter for other administrative examinations: Secondary | ICD-10-CM

## 2017-07-01 LAB — TSH: TSH: 1.2 u[IU]/mL (ref 0.35–4.50)

## 2017-07-01 MED ORDER — DICYCLOMINE HCL 10 MG PO CAPS: 10.0000 mg | ORAL_CAPSULE | Freq: Three times a day (TID) | ORAL | 11 refills | Status: DC

## 2017-07-01 MED ORDER — NA SULFATE-K SULFATE-MG SULF 17.5-3.13-1.6 GM/177ML PO SOLN: 1.0000 | Freq: Once | ORAL | 0 refills | Status: AC

## 2017-07-01 MED ORDER — RANITIDINE HCL 300 MG PO TABS: 300.0000 mg | ORAL_TABLET | Freq: Two times a day (BID) | ORAL | 11 refills | Status: DC

## 2017-07-01 NOTE — Progress Notes (Deleted)
  HPI:  Using dictation device. Unfortunately this device frequently misinterprets words/phrases.  Follow up: Due for recheck urine dip/micro  Change in bowels: -This is been going on at least a year, maybe even for several years to some extent -Symptoms include frequent bowel movements that often are loose with some discomfort prior to bowel movements such as gas and bloating -Denies fevers, malaise, vomiting, nausea, diarrhea, melena, hematochezia, unexplained weight loss -She is worried about Crohn's disease and other types of things like this and wants to see a gastroenterologist - referral placed 04/2017 along with trying dairy free -She has a history of acid reflux, denies any family history of IBD -Did have a lot of anxiety and mood changes with the passing of her mother, but now doing much better in terms of this  Pain in the feet: -at least 3-4 months -Sharp and worse with first few steps in the morning and the plantar fascial region -No history of trauma, weakness, numbness or known injury -treated 04/2017 for suspected plantar fasciitis - ice, stretching, strassburg sock  Urinary frequency: -Long-standing, seeing her GYN about this -Not doing Kegle's recommended -She is worried about diabetes - screened with labs -sm amount blood on urine  History of tobacco use/history of alcohol use: -Currently smoking 8 cigarettes a day, working to cut back -Was drinking about 3 glasses of wine every other day, now rarely drinks and only on the weekends -continue to offer help   ROS: See pertinent positives and negatives per HPI.  Past Medical History:  Diagnosis Date  . Asthma   . Back pain  - chornic neck and back pain, sees ortho ([facility]), had neg rheum workup per her report 05/19/2014  . Frequent headaches 05/19/2014    No past surgical history on file.  Family History  Problem Relation Age of Onset  . Rheum arthritis Mother   . Hepatitis C Mother   . Alcohol abuse  Mother   . Cancer Mother        lung, smoker  . Alcohol abuse Father        deceased    SOCIAL HX: ***   Current Outpatient Medications:  .  levonorgestrel (MIRENA) 20 MCG/24HR IUD, 1 each by Intrauterine route once., Disp: , Rfl:   EXAM:  There were no vitals filed for this visit.  There is no height or weight on file to calculate BMI.  GENERAL: vitals reviewed and listed above, alert, oriented, appears well hydrated and in no acute distress  HEENT: atraumatic, conjunttiva clear, no obvious abnormalities on inspection of external nose and ears  NECK: no obvious masses on inspection  LUNGS: clear to auscultation bilaterally, no wheezes, rales or rhonchi, good air movement  CV: HRRR, no peripheral edema  MS: moves all extremities without noticeable abnormality *** PSYCH: pleasant and cooperative, no obvious depression or anxiety  ASSESSMENT AND PLAN:  Discussed the following assessment and plan:  No diagnosis found.  *** -Patient advised to return or notify a doctor immediately if symptoms worsen or persist or new concerns arise.  There are no Patient Instructions on file for this visit.  Lucretia Kern, DO

## 2017-07-01 NOTE — Patient Instructions (Signed)
Your provider has requested that you go to the basement level for lab work before leaving today. Press "B" on the elevator. The lab is located at the first door on the left as you exit the elevator.  We have sent the following medications to your pharmacy for you to pick up at your convenience: dicyclomine and zantac.   Patient advised to avoid spicy, acidic, citrus, chocolate, mints, fruit and fruit juices.  Limit the intake of caffeine, alcohol and Soda.  Don't exercise too soon after eating.  Don't lie down within 3-4 hours of eating.  Elevate the head of your bed.  You have been scheduled for an endoscopy and colonoscopy. Please follow the written instructions given to you at your visit today. Please pick up your prep supplies at the pharmacy within the next 1-3 days. If you use inhalers (even only as needed), please bring them with you on the day of your procedure. Your physician has requested that you go to www.startemmi.com and enter the access code given to you at your visit today. This web site gives a general overview about your procedure. However, you should still follow specific instructions given to you by our office regarding your preparation for the procedure.  Normal BMI (Body Mass Index- based on height and weight) is between 19 and 25. Your BMI today is Body mass index is 33.11 kg/m. Marland Kitchen Please consider follow up  regarding your BMI with your Primary Care Provider.  Thank you for choosing me and [facility].  Pricilla Riffle. Dagoberto Ligas., MD., [facility] Regal

## 2017-07-01 NOTE — Progress Notes (Signed)
History of Present Illness: This is a 42 year old female referred by Lucretia Kern, DO for the evaluation of generalized abdominal pain, diarrhea, bloating, GERD.  She relates many years of problems with generalized abdominal pain, abdominal bloating, worsening of symptoms after meals, postprandial diarrhea and frequent reflux symptoms following meals.  She has not previously had a evaluation for these symptoms.  She notes intolerance to milk products and high fat foods.  She states she is tried ranitidine on several occasions which helps her reflux symptoms however she has not remained on medication.  Denies weight loss, constipation, change in stool caliber, melena, hematochezia, nausea, vomiting, dysphagia, chest pain.  CMP, CBC, celiac panel in 04/2017 all unremarkable   No Known Allergies Outpatient Medications Prior to Visit  Medication Sig Dispense Refill  . levonorgestrel (MIRENA) 20 MCG/24HR IUD 1 each by Intrauterine route once.     No facility-administered medications prior to visit.    Past Medical History:  Diagnosis Date  . Asthma   . Back pain  - chornic neck and back pain, sees ortho ([facility]), had neg rheum workup per her report 05/19/2014  . Frequent headaches 05/19/2014   History reviewed. No pertinent surgical history. Social History   Socioeconomic History  . Marital status: Single    Spouse name: Not on file  . Number of children: 2  . Years of education: Not on file  . Highest education level: Not on file  Occupational History  . Not on file  Social Needs  . Financial resource strain: Not on file  . Food insecurity:    Worry: Not on file    Inability: Not on file  . Transportation needs:    Medical: Not on file    Non-medical: Not on file  Tobacco Use  . Smoking status: Current Every Day Smoker    Packs/day: 0.50    Types: Cigarettes  . Smokeless tobacco: Never Used  Substance and Sexual Activity  . Alcohol use: Yes    Comment: WINE ON THE WEEKEND    . Drug use: No  . Sexual activity: Yes    Partners: Male    Birth control/protection: IUD    Comment: 1ST intercourse- 17, partners- 13, current partner- 23   Lifestyle  . Physical activity:    Days per week: Not on file    Minutes per session: Not on file  . Stress: Not on file  Relationships  . Social connections:    Talks on phone: Not on file    Gets together: Not on file    Attends religious service: Not on file    Active member of club or organization: Not on file    Attends meetings of clubs or organizations: Not on file    Relationship status: Not on file  Other Topics Concern  . 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 Rheum arthritis Mother   . Hepatitis C Mother   . Alcohol abuse Mother   . Cancer Mother        lung, smoker  .  Alcohol abuse Father        deceased      Review of Systems: Pertinent positive and negative review of systems were noted in the above HPI section. All other review of systems were otherwise negative.    Physical Exam: General: Well developed, well nourished, no acute distress Head: Normocephalic and atraumatic Eyes:  sclerae anicteric, EOMI Ears: Normal auditory acuity Mouth: No deformity or lesions Neck: Supple, no masses or thyromegaly Lungs: Clear throughout to auscultation Heart: Regular rate and rhythm; no murmurs, rubs or bruits Abdomen: Soft, non tender and non distended. No masses, hepatosplenomegaly or hernias noted. Normal Bowel sounds Rectal: deferred to colonoscopy Musculoskeletal: Symmetrical with no gross deformities  Skin: No lesions on visible extremities Pulses:  Normal pulses  noted Extremities: No clubbing, cyanosis, edema or deformities noted Neurological: Alert oriented x 4, grossly nonfocal Cervical Nodes:  No significant cervical adenopathy Inguinal Nodes: No significant inguinal adenopathy Psychological:  Alert and cooperative. Anxious.    Assessment and Recommendations:  1.  Generalized abdominal pain, bloating, defecation urgency, diarrhea.  Suspected IBS-D.  Rule out IBD, colorectal neoplasms.  Check TSH.  Begin dicyclomine 10 mg before meals 3 times daily.  Avoid foods that trigger symptoms.  Advised a 7-day trial of a lactose-free diet and if symptoms not completely improved begin a 7-day trial of a gluten-free diet.  Schedule colonoscopy. The risks (including bleeding, perforation, infection, missed lesions, medication reactions and possible hospitalization or surgery if complications occur), benefits, and alternatives to colonoscopy with possible biopsy and possible polypectomy were discussed with the patient and they consent to proceed.   2. GERD.  Follow standard antireflux measures.  Begin ranitidine 300 mg twice daily.  Schedule EGD. The risks (including bleeding, perforation, infection, missed lesions, medication reactions and possible hospitalization or surgery if complications occur), benefits, and alternatives to endoscopy with possible biopsy and possible dilation were discussed with the patient and they consent to proceed.    cc: Lucretia Kern, DO [address] [place], [place] [address]

## 2017-07-22 ENCOUNTER — Encounter (INDEPENDENT_AMBULATORY_CARE_PROVIDER_SITE_OTHER): Payer: Self-pay | Admitting: Orthopedic Surgery

## 2017-07-22 ENCOUNTER — Ambulatory Visit (INDEPENDENT_AMBULATORY_CARE_PROVIDER_SITE_OTHER): Payer: BLUE CROSS/BLUE SHIELD

## 2017-07-22 ENCOUNTER — Ambulatory Visit (INDEPENDENT_AMBULATORY_CARE_PROVIDER_SITE_OTHER): Payer: BLUE CROSS/BLUE SHIELD | Admitting: Orthopedic Surgery

## 2017-07-22 DIAGNOSIS — M542 Cervicalgia: Secondary | ICD-10-CM | POA: Diagnosis not present

## 2017-07-22 DIAGNOSIS — M25561 Pain in right knee: Secondary | ICD-10-CM

## 2017-07-22 DIAGNOSIS — G894 Chronic pain syndrome: Secondary | ICD-10-CM | POA: Diagnosis not present

## 2017-07-22 DIAGNOSIS — M25562 Pain in left knee: Secondary | ICD-10-CM | POA: Diagnosis not present

## 2017-07-22 DIAGNOSIS — M545 Low back pain: Secondary | ICD-10-CM | POA: Diagnosis not present

## 2017-07-22 DIAGNOSIS — M2550 Pain in unspecified joint: Secondary | ICD-10-CM | POA: Diagnosis not present

## 2017-07-22 NOTE — Progress Notes (Signed)
Office Visit Note   Patient: Tracie Riley           Date of Birth: Apr 17, 1975           MRN: [id number] Visit Date: 07/22/2017              Requested by: Lucretia Kern, DO [address] [place], [place] [address] PCP: Lucretia Kern, DO  Chief Complaint  Patient presents with  . Right Knee - Pain      HPI: Patient is a 42 year old woman who complains of giving way pain and the right knee worse than the left knee.  She complains of pain in the lateral aspect of the patella.  Denies any specific trauma injury.  Assessment & Plan: Visit Diagnoses:  1. Left knee pain, unspecified chronicity   2. Right knee pain, unspecified chronicity     Plan: Patient has lateral tracking of the patella bilaterally with giving way secondary to chondromalacia the patellofemoral joint.  Patient was given instructions for quad isometric straight leg raises to be done daily.  Follow-up if she is still symptomatic.  Patient may require formalized physical therapy.  Follow-Up Instructions: No follow-ups on file.   Ortho Exam  Patient is alert, oriented, no adenopathy, well-dressed, normal affect, normal respiratory effort. Examination patient has normal gait.  She has no tenderness to palpation over the medial or lateral joint lines bilaterally.  Collaterals and cruciates are stable bilaterally.  She has no effusion of the right knee she has some mild crepitation of the patellofemoral joint with range of motion and is tender to palpation of the lateral facet of the patella.  She does have atrophy of the VMO and has lateral tilting and tracking of the patella.  Imaging: No results found. No images are attached to the encounter.  Labs: Lab Results  Component Value Date   HGBA1C 5.6 05/12/2017   HGBA1C 5.2 08/19/2014    Lab Results  Component Value Date/Time   HGBA1C 5.6 05/12/2017 09:37 AM   HGBA1C 5.2 08/19/2014 09:58 AM    There is no height or weight on file to calculate  BMI.  Orders:  Orders Placed This Encounter  Procedures  . XR Knee 1-2 Views Left  . XR Knee 1-2 Views Right  . Rheumatoid Factor  . Uric acid  . Antinuclear Antib (ANA)  . Sed Rate (ESR)   No orders of the defined types were placed in this encounter.    Procedures: No procedures performed  Clinical Data: No additional findings.  ROS:  All other systems negative, except as noted in the HPI. Review of Systems  Objective: Vital Signs: There were no vitals taken for this visit.  Specialty Comments:  No specialty comments available.  PMFS History: Patient Active Problem List   Diagnosis Date Noted  . Bereavment, Anxiety 08/19/2014  . BMI 32.0-32.9,adult 08/19/2014  . Back pain  - chornic neck and back pain, sees ortho ([facility]), had neg rheum workup per her report 05/19/2014  . Frequent headaches 05/19/2014  . Tobacco use disorder 05/19/2014  . Peri-menopause 01/07/2013   Past Medical History:  Diagnosis Date  . Asthma   . Back pain  - chornic neck and back pain, sees ortho ([facility]), had neg rheum workup per her report 05/19/2014  . Frequent headaches 05/19/2014    Family History  Problem Relation Age of Onset  . Rheum arthritis Mother   . Hepatitis C Mother   . Alcohol abuse Mother   .  Cancer Mother        lung, smoker  . Alcohol abuse Father        deceased    No past surgical history on file. Social History   Occupational History  . Not on file  Tobacco Use  . Smoking status: Current Every Day Smoker    Packs/day: 0.50    Types: Cigarettes  . Smokeless tobacco: Never Used  Substance and Sexual Activity  . Alcohol use: Yes    Comment: WINE ON THE WEEKEND  . Drug use: No  . Sexual activity: Yes    Partners: Male    Birth control/protection: IUD    Comment: 1ST intercourse- 77, partners- 41, current partner- 20

## 2017-07-23 LAB — URIC ACID: Uric Acid, Serum: 4.4 mg/dL (ref 2.5–7.0)

## 2017-07-23 LAB — RHEUMATOID FACTOR

## 2017-07-23 LAB — SEDIMENTATION RATE: SED RATE: 6 mm/h (ref 0–20)

## 2017-07-28 ENCOUNTER — Encounter (INDEPENDENT_AMBULATORY_CARE_PROVIDER_SITE_OTHER): Payer: Self-pay | Admitting: Radiology

## 2017-08-06 ENCOUNTER — Encounter: Payer: BLUE CROSS/BLUE SHIELD | Admitting: Gastroenterology

## 2017-08-12 ENCOUNTER — Encounter: Payer: BLUE CROSS/BLUE SHIELD | Admitting: Gastroenterology

## 2017-11-18 ENCOUNTER — Ambulatory Visit: Payer: BLUE CROSS/BLUE SHIELD | Admitting: Family Medicine

## 2017-11-18 ENCOUNTER — Telehealth: Payer: Self-pay | Admitting: Family Medicine

## 2017-11-18 ENCOUNTER — Encounter: Payer: Self-pay | Admitting: Family Medicine

## 2017-11-18 VITALS — BP 100/60 | HR 79 | Temp 98.2°F | Ht 67.0 in | Wt 211.0 lb

## 2017-11-18 DIAGNOSIS — J989 Respiratory disorder, unspecified: Secondary | ICD-10-CM | POA: Diagnosis not present

## 2017-11-18 DIAGNOSIS — J029 Acute pharyngitis, unspecified: Secondary | ICD-10-CM | POA: Diagnosis not present

## 2017-11-18 DIAGNOSIS — J40 Bronchitis, not specified as acute or chronic: Secondary | ICD-10-CM | POA: Diagnosis not present

## 2017-11-18 LAB — POCT RAPID STREP A (OFFICE): Rapid Strep A Screen: NEGATIVE

## 2017-11-18 MED ORDER — BENZONATATE 100 MG PO CAPS: 100.0000 mg | ORAL_CAPSULE | Freq: Two times a day (BID) | ORAL | 0 refills | Status: DC | PRN

## 2017-11-18 MED ORDER — ALBUTEROL SULFATE HFA 108 (90 BASE) MCG/ACT IN AERS: 2.0000 | INHALATION_SPRAY | Freq: Four times a day (QID) | RESPIRATORY_TRACT | 0 refills | Status: DC | PRN

## 2017-11-18 NOTE — Addendum Note (Signed)
Addended by: Agnes Lawrence on: 11/18/2017 04:29 PM   Modules accepted: Orders

## 2017-11-18 NOTE — Progress Notes (Signed)
HPI:  Using dictation device. Unfortunately this device frequently misinterprets words/phrases.   Acute visit for respiratory illness: -started:4 days ago -symptoms:nasal congestion, sore throat, cough, Ha, some shoulder/muscle aches, wheezing occ -denies:fever, SOB, NVD, tooth pain -sick contacts/travel/risks: no reported flu, strep or tick exposure; daughter and another child with similar symptoms -Hx of: wheezing with cold - alb use in the past for this  ROS: See pertinent positives and negatives per HPI.  Past Medical History:  Diagnosis Date  . Asthma   . Back pain  - chornic neck and back pain, sees ortho ([facility]), had neg rheum workup per her report 05/19/2014  . Frequent headaches 05/19/2014    History reviewed. No pertinent surgical history.  Family History  Problem Relation Age of Onset  . Rheum arthritis Mother   . Hepatitis C Mother   . Alcohol abuse Mother   . Cancer Mother        lung, smoker  . Alcohol abuse Father        deceased    Social History   Socioeconomic History  . Marital status: Single    Spouse name: Not on file  . Number of children: 2  . Years of education: Not on file  . Highest education level: Not on file  Occupational History  . Not on file  Social Needs  . Financial resource strain: Not on file  . Food insecurity:    Worry: Not on file    Inability: Not on file  . Transportation needs:    Medical: Not on file    Non-medical: Not on file  Tobacco Use  . Smoking status: Current Every Day Smoker    Packs/day: 0.50    Types: Cigarettes  . Smokeless tobacco: Never Used  Substance and Sexual Activity  . Alcohol use: Yes    Comment: WINE ON THE WEEKEND  . Drug use: No  . Sexual activity: Yes    Partners: Male    Birth control/protection: IUD    Comment: 1ST intercourse- 17, partners- 55, current partner- 19   Lifestyle  . Physical activity:    Days per week: Not on file    Minutes per session: Not on file  . Stress:  Not on file  Relationships  . Social connections:    Talks on phone: Not on file    Gets together: Not on file    Attends religious service: Not on file    Active member of club or organization: Not on file    Attends meetings of clubs or organizations: Not on file    Relationship status: Not on file  Other Topics Concern  . 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  dicyclomine (BENTYL) 10 MG capsule, Take 1 capsule (10 mg total) by mouth 3 (three) times daily before meals., Disp: 90 capsule, Rfl: 11 .  levonorgestrel (MIRENA) 20 MCG/24HR IUD, 1 each by Intrauterine route once., Disp: , Rfl:  .  ranitidine (ZANTAC) 300 MG tablet, Take 1 tablet (300 mg total) by mouth 2 (two) times daily., Disp: 60 tablet, Rfl:  11 .  albuterol (PROVENTIL HFA;VENTOLIN HFA) 108 (90 Base) MCG/ACT inhaler, Inhale 2 puffs into the lungs every 6 (six) hours as needed., Disp: 1 Inhaler, Rfl: 0 .  benzonatate (TESSALON) 100 MG capsule, Take 1 capsule (100 mg total) by mouth 2 (two) times daily as needed for cough., Disp: 20 capsule, Rfl: 0  EXAM:  Vitals:   11/18/17 1602  BP: 100/60  Pulse: 79  Temp: 98.2 F (36.8 C)  SpO2: 97%    Body mass index is 33.05 kg/m.  GENERAL: vitals reviewed and listed above, alert, oriented, appears well hydrated and in no acute distress  HEENT: atraumatic, conjunttiva clear, no obvious abnormalities on inspection of external nose and ears, normal appearance of ear canals and TMs, clear nasal congestion, mild post oropharyngeal erythema with PND, no tonsillar edema or exudate, no sinus TTP  NECK: no obvious masses on inspection  LUNGS: clear to auscultation  bilaterally, no wheezes, rales or rhonchi, good air movement  CV: HRRR, no peripheral edema  MS: moves all extremities without noticeable abnormality  PSYCH: pleasant and cooperative, no obvious depression or anxiety  ASSESSMENT AND PLAN:  Discussed the following assessment and plan:  Respiratory illness  Bronchitis  Sore throat  -given HPI and exam findings today, a serious infection or illness is unlikely. We discussed potential etiologies, with VURI being most likely, and advised supportive care and monitoring. We discussed treatment side effects, likely course, antibiotic misuse, transmission, and signs of developing a serious illness. -offered flu and strep testing (though feel not likely given no fever and early for flu), she opted not to do flu testing as out of window from benefit from tamiflu and unlikely, she wanted to do rapid strep test -alb for possible mild bronchitis and tessalon for cough after discussion risks -of course, we advised to return or notify a doctor immediately if symptoms worsen,new concerns arise or symptoms persist longer then expected.    Patient Instructions  INSTRUCTIONS FOR UPPER RESPIRATORY INFECTION:  -plenty of rest and fluids  -nasal saline wash 2-3 times daily (use prepackaged nasal saline or bottled/distilled water if making your own)   -can use AFRIN nasal spray for drainage and nasal congestion - but do NOT use longer then 3-4 days  -can use tylenol (in no history of liver disease) or ibuprofen (if no history of kidney disease, bowel bleeding or significant heart disease) as directed for aches and sorethroat  -in the winter time, using a humidifier at night is helpful (please follow cleaning instructions)  -if you are taking a cough medication - use only as directed, may also try a teaspoon of honey to coat the throat and throat lozenges. If given a cough medication with codeine or hydrocodone or other narcotic please be advised that  this contains a strong and  potentially addicting medication. Please follow instructions carefully, take as little as possible and only use AS NEEDED for severe cough. Discuss potential side effects with your pharmacy. Please do not drive or operate machinery while taking these types of medications. Please do not take other sedating medications, drugs or alcohol while taking this medication without discussing with your doctor.  -for sore throat, salt water gargles can help  -follow up if you have fevers, facial pain, tooth pain, difficulty breathing or are worsening or symptoms persist longer then expected  Upper Respiratory Infection, Adult An upper respiratory infection (URI) is also known as the common cold. It is often caused by a type of germ (virus). Colds  are easily spread (contagious). You can pass it to others by kissing, coughing, sneezing, or drinking out of the same glass. Usually, you get better in 1 to 3  weeks.  However, the cough can last for even longer. HOME CARE   Only take medicine as told by your doctor. Follow instructions provided above.  Drink enough water and fluids to keep your pee (urine) clear or pale yellow.  Get plenty of rest.  Return to work when your temperature is < 100 for 24 hours or as told by your doctor. You may use a face mask and wash your hands to stop your cold from spreading. GET HELP RIGHT AWAY IF:   After the first few days, you feel you are getting worse.  You have questions about your medicine.  You have shortness of breath, or red spit (mucus).  You have pain in the face for more then 2 days, especially when you bend forward.  You have a fever, puffy (swollen) neck, pain when you swallow, or white spots in the back of your throat.  You have a worsening headache, ear pain, sinus pain, or chest pain.  You have a high-pitched whistling sound when you breathe in and out (wheezing).  You cough up blood. MAKE SURE YOU:   Understand these  instructions.  Will watch your condition.  Will get help right away if you are not doing well or get worse. Document Released: 08/21/2007 Document Revised: 05/27/2011 Document Reviewed: 06/09/2013 [facility] Patient Information 2015 [place], [place]. This information is not intended to replace advice given to you by your health care provider. Make sure you discuss any questions you have with your health care provider.  Benzonatate capsules What is this medicine? BENZONATATE (ben ZOE na tate) is used to treat cough. This medicine may be used for other purposes; ask your health care provider or pharmacist if you have questions. COMMON BRAND NAME(S): Tessalon Perles, Zonatuss What should I tell my health care provider before I take this medicine? They need to know if you have any of these conditions: -kidney or liver disease -an unusual or allergic reaction to benzonatate, anesthetics, other medicines, foods, dyes, or preservatives -pregnant or trying to get pregnant -breast-feeding How should I use this medicine? Take this medicine by mouth with a glass of water. Follow the directions on the prescription label. Avoid breaking, chewing, or sucking the capsule, as this can cause serious side effects. Take your medicine at regular intervals. Do not take your medicine more often than directed. Talk to your pediatrician regarding the use of this medicine in children. While this drug may be prescribed for children as young as 40 years old for selected conditions, precautions do apply. Overdosage: If you think you have taken too much of this medicine contact a poison control center or emergency room at once. NOTE: This medicine is only for you. Do not share this medicine with others. What if I miss a dose? If you miss a dose, take it as soon as you can. If it is almost time for your next dose, take only that dose. Do not take double or extra doses. What may interact with this medicine? Do not take  this medicine with any of the following medications: -MAOIs like Carbex, Eldepryl, Marplan, Nardil, and Parnate This list may not describe all possible interactions. Give your health care provider a list of all the medicines, herbs, non-prescription drugs, or dietary supplements you use. Also tell them if you smoke, drink alcohol, or  use illegal drugs. Some items may interact with your medicine. What should I watch for while using this medicine? Tell your doctor if your symptoms do not improve or if they get worse. If you have a high fever, skin rash, or headache, see your health care professional. You may get drowsy or dizzy. Do not drive, use machinery, or do anything that needs mental alertness until you know how this medicine affects you. Do not sit or stand up quickly, especially if you are an older patient. This reduces the risk of dizzy or fainting spells. What side effects may I notice from receiving this medicine? Side effects that you should report to your doctor or health care professional as soon as possible: -allergic reactions like skin rash, itching or hives, swelling of the face, lips, or tongue -breathing problems -chest pain -confusion or hallucinations -irregular heartbeat -numbness of mouth or throat -seizures Side effects that usually do not require medical attention (report to your doctor or health care professional if they continue or are bothersome): -burning feeling in the eyes -constipation -headache -nasal congestion -stomach upset This list may not describe all possible side effects. Call your doctor for medical advice about side effects. You may report side effects to FDA at 1-800-FDA-1088. Where should I keep my medicine? Keep out of the reach of children. Store at room temperature between 15 and 30 degrees C (59 and 86 degrees F). Keep tightly closed. Protect from light and moisture. Throw away any unused medicine after the expiration date. NOTE: This sheet is  a summary. It may not cover all possible information. If you have questions about this medicine, talk to your doctor, pharmacist, or health care provider.  2018 Elsevier/Gold Standard (2007-06-03 14:52:56)    Lucretia Kern, DO

## 2017-11-18 NOTE — Telephone Encounter (Signed)
Copied from [place] [id number]. Topic: Quick Communication - See Telephone Encounter >> Nov 18, 2017  4:45 PM Vernona Rieger wrote: CRM for notification. See Telephone encounter for: 11/18/17.  Patient states she was just in the office and had Dr Maudie Mercury fax over her albuterol (PROVENTIL HFA;VENTOLIN HFA) 108 (90 Base) MCG/ACT inhaler & benzonatate (TESSALON) 100 MG capsule to [facility] but per insurance it has to go to [facility] at [employer].

## 2017-11-18 NOTE — Patient Instructions (Signed)
INSTRUCTIONS FOR UPPER RESPIRATORY INFECTION:  -plenty of rest and fluids  -nasal saline wash 2-3 times daily (use prepackaged nasal saline or bottled/distilled water if making your own)   -can use AFRIN nasal spray for drainage and nasal congestion - but do NOT use longer then 3-4 days  -can use tylenol (in no history of liver disease) or ibuprofen (if no history of kidney disease, bowel bleeding or significant heart disease) as directed for aches and sorethroat  -in the winter time, using a humidifier at night is helpful (please follow cleaning instructions)  -if you are taking a cough medication - use only as directed, may also try a teaspoon of honey to coat the throat and throat lozenges. If given a cough medication with codeine or hydrocodone or other narcotic please be advised that this contains a strong and  potentially addicting medication. Please follow instructions carefully, take as little as possible and only use AS NEEDED for severe cough. Discuss potential side effects with your pharmacy. Please do not drive or operate machinery while taking these types of medications. Please do not take other sedating medications, drugs or alcohol while taking this medication without discussing with your doctor.  -for sore throat, salt water gargles can help  -follow up if you have fevers, facial pain, tooth pain, difficulty breathing or are worsening or symptoms persist longer then expected  Upper Respiratory Infection, Adult An upper respiratory infection (URI) is also known as the common cold. It is often caused by a type of germ (virus). Colds are easily spread (contagious). You can pass it to others by kissing, coughing, sneezing, or drinking out of the same glass. Usually, you get better in 1 to 3  weeks.  However, the cough can last for even longer. HOME CARE   Only take medicine as told by your doctor. Follow instructions provided above.  Drink enough water and fluids to keep your pee  (urine) clear or pale yellow.  Get plenty of rest.  Return to work when your temperature is < 100 for 24 hours or as told by your doctor. You may use a face mask and wash your hands to stop your cold from spreading. GET HELP RIGHT AWAY IF:   After the first few days, you feel you are getting worse.  You have questions about your medicine.  You have shortness of breath, or red spit (mucus).  You have pain in the face for more then 2 days, especially when you bend forward.  You have a fever, puffy (swollen) neck, pain when you swallow, or white spots in the back of your throat.  You have a worsening headache, ear pain, sinus pain, or chest pain.  You have a high-pitched whistling sound when you breathe in and out (wheezing).  You cough up blood. MAKE SURE YOU:   Understand these instructions.  Will watch your condition.  Will get help right away if you are not doing well or get worse. Document Released: 08/21/2007 Document Revised: 05/27/2011 Document Reviewed: 06/09/2013 [facility] Patient Information 2015 [place], [place]. This information is not intended to replace advice given to you by your health care provider. Make sure you discuss any questions you have with your health care provider.  Benzonatate capsules What is this medicine? BENZONATATE (ben ZOE na tate) is used to treat cough. This medicine may be used for other purposes; ask your health care provider or pharmacist if you have questions. COMMON BRAND NAME(S): Tessalon Perles, Zonatuss What should I tell my health  care provider before I take this medicine? They need to know if you have any of these conditions: -kidney or liver disease -an unusual or allergic reaction to benzonatate, anesthetics, other medicines, foods, dyes, or preservatives -pregnant or trying to get pregnant -breast-feeding How should I use this medicine? Take this medicine by mouth with a glass of water. Follow the directions on the prescription  label. Avoid breaking, chewing, or sucking the capsule, as this can cause serious side effects. Take your medicine at regular intervals. Do not take your medicine more often than directed. Talk to your pediatrician regarding the use of this medicine in children. While this drug may be prescribed for children as young as 86 years old for selected conditions, precautions do apply. Overdosage: If you think you have taken too much of this medicine contact a poison control center or emergency room at once. NOTE: This medicine is only for you. Do not share this medicine with others. What if I miss a dose? If you miss a dose, take it as soon as you can. If it is almost time for your next dose, take only that dose. Do not take double or extra doses. What may interact with this medicine? Do not take this medicine with any of the following medications: -MAOIs like Carbex, Eldepryl, Marplan, Nardil, and Parnate This list may not describe all possible interactions. Give your health care provider a list of all the medicines, herbs, non-prescription drugs, or dietary supplements you use. Also tell them if you smoke, drink alcohol, or use illegal drugs. Some items may interact with your medicine. What should I watch for while using this medicine? Tell your doctor if your symptoms do not improve or if they get worse. If you have a high fever, skin rash, or headache, see your health care professional. You may get drowsy or dizzy. Do not drive, use machinery, or do anything that needs mental alertness until you know how this medicine affects you. Do not sit or stand up quickly, especially if you are an older patient. This reduces the risk of dizzy or fainting spells. What side effects may I notice from receiving this medicine? Side effects that you should report to your doctor or health care professional as soon as possible: -allergic reactions like skin rash, itching or hives, swelling of the face, lips, or  tongue -breathing problems -chest pain -confusion or hallucinations -irregular heartbeat -numbness of mouth or throat -seizures Side effects that usually do not require medical attention (report to your doctor or health care professional if they continue or are bothersome): -burning feeling in the eyes -constipation -headache -nasal congestion -stomach upset This list may not describe all possible side effects. Call your doctor for medical advice about side effects. You may report side effects to FDA at 1-800-FDA-1088. Where should I keep my medicine? Keep out of the reach of children. Store at room temperature between 15 and 30 degrees C (59 and 86 degrees F). Keep tightly closed. Protect from light and moisture. Throw away any unused medicine after the expiration date. NOTE: This sheet is a summary. It may not cover all possible information. If you have questions about this medicine, talk to your doctor, pharmacist, or health care provider.  2018 Elsevier/Gold Standard (2007-06-03 14:52:56)

## 2017-11-18 NOTE — Telephone Encounter (Signed)
Rx done. 

## 2017-11-19 ENCOUNTER — Ambulatory Visit
Admission: RE | Admit: 2017-11-19 | Discharge: 2017-11-19 | Disposition: A | Discharge status: Home / Self Care | Payer: BLUE CROSS/BLUE SHIELD | Source: Ambulatory Visit | Attending: Obstetrics & Gynecology | Admitting: Obstetrics & Gynecology

## 2017-11-19 ENCOUNTER — Other Ambulatory Visit: Payer: Self-pay | Admitting: Obstetrics & Gynecology

## 2017-11-19 DIAGNOSIS — N6489 Other specified disorders of breast: Secondary | ICD-10-CM

## 2017-11-19 DIAGNOSIS — R928 Other abnormal and inconclusive findings on diagnostic imaging of breast: Secondary | ICD-10-CM | POA: Diagnosis not present

## 2017-11-19 DIAGNOSIS — N6323 Unspecified lump in the left breast, lower outer quadrant: Secondary | ICD-10-CM | POA: Diagnosis not present

## 2018-02-11 ENCOUNTER — Encounter: Payer: Self-pay | Admitting: Obstetrics & Gynecology

## 2018-02-11 ENCOUNTER — Ambulatory Visit (INDEPENDENT_AMBULATORY_CARE_PROVIDER_SITE_OTHER): Payer: BLUE CROSS/BLUE SHIELD | Admitting: Obstetrics & Gynecology

## 2018-02-11 VITALS — BP 126/84 | Ht 67.0 in | Wt 207.0 lb

## 2018-02-11 DIAGNOSIS — Z23 Encounter for immunization: Secondary | ICD-10-CM | POA: Diagnosis not present

## 2018-02-11 DIAGNOSIS — E6609 Other obesity due to excess calories: Secondary | ICD-10-CM

## 2018-02-11 DIAGNOSIS — Z01419 Encounter for gynecological examination (general) (routine) without abnormal findings: Secondary | ICD-10-CM | POA: Diagnosis not present

## 2018-02-11 DIAGNOSIS — Z6832 Body mass index (BMI) 32.0-32.9, adult: Secondary | ICD-10-CM

## 2018-02-11 DIAGNOSIS — Z30431 Encounter for routine checking of intrauterine contraceptive device: Secondary | ICD-10-CM | POA: Diagnosis not present

## 2018-02-11 NOTE — Patient Instructions (Signed)
1. Well female exam with routine gynecological exam Normal gynecologic exam.  Pap test was negative with negative high-risk HPV November 2018.  Will repeat at 2 to 3 years.  Breast exam normal.  Screening mammogram in September 2019, right normal, left probably benign.  Will follow-up with a left diagnostic mammogram in 6 months.  Health labs with family physician.  2. Encounter for routine checking of intrauterine contraceptive device (IUD) Mirena IUD in good position and well-tolerated since March 2015.  Will come back in March 2025 to change to a new one.  3. Class 1 obesity due to excess calories without serious comorbidity with body mass index (BMI) of 32.0 to 32.9 in adult Recommend lower calorie/lower carb diet such as [employer].  Increase physical activity with aerobic activities such as fast-paced walking 5 times a week and weightlifting every 2 days.  4. Flu vaccine need  Other orders - Flu Vaccine QUAD 36+ mos IM (Fluarix, Quad PF)  Sianni, it was a pleasure seeing you today!

## 2018-02-11 NOTE — Progress Notes (Signed)
[place] [birth year] [id number]   History:    42 y.o. G3P2A1L2 Married  RP:  Established patient presenting for annual gyn exam   HPI: Well on Mirena IUD x 05/2013.  Wants another Mirena IUD at that time.  No abnormal bleeding.  No pelvic pain.  No pain with intercourse.Urine and bowel movements normal.  Breasts normal.  Body mass index 32.42.  Needs to exercise more frequently.  Health labs with family physician.   Past medical history,surgical history, family history and social history were all reviewed and documented in the EPIC chart.  Gynecologic History No LMP recorded. (Menstrual status: IUD). Contraception: Mirena IUD x 05/2013 Last Pap: 01/2017. Results were: Negative/HPV HR neg Last mammogram: 11/2017. Results were: Left probably benign.  Lt Dx in 6 months Bone Density: Never Colonoscopy: Never  Obstetric History OB History  Gravida Para Term Preterm AB Living  3 2 2  0 1 2  SAB TAB Ectopic Multiple Live Births  1 0 0 0      # Outcome Date GA Lbr Len/2nd Weight Sex Delivery Anes PTL Lv  3 SAB           2 Term           1 Term              ROS: A ROS was performed and pertinent positives and negatives are included in the history.  GENERAL: No fevers or chills. HEENT: No change in vision, no earache, sore throat or sinus congestion. NECK: No pain or stiffness. CARDIOVASCULAR: No chest pain or pressure. No palpitations. PULMONARY: No shortness of breath, cough or wheeze. GASTROINTESTINAL: No abdominal pain, nausea, vomiting or diarrhea, melena or bright red blood per rectum. GENITOURINARY: No urinary frequency, urgency, hesitancy or dysuria. MUSCULOSKELETAL: No joint or muscle pain, no back pain, no recent trauma. DERMATOLOGIC: No rash, no itching, no lesions. ENDOCRINE: No polyuria, polydipsia, no heat or cold intolerance. No recent change in weight. HEMATOLOGICAL: No anemia or easy bruising or bleeding. NEUROLOGIC: No headache, seizures, numbness, tingling or  weakness. PSYCHIATRIC: No depression, no loss of interest in normal activity or change in sleep pattern.     Exam:   BP 126/84   Ht 5\' 7"  (1.702 m)   Wt 207 lb (93.9 kg)   BMI 32.42 kg/m   Body mass index is 32.42 kg/m.  General appearance : Well developed well nourished female. No acute distress HEENT: Eyes: no retinal hemorrhage or exudates,  Neck supple, trachea midline, no carotid bruits, no thyroidmegaly Lungs: Clear to auscultation, no rhonchi or wheezes, or rib retractions  Heart: Regular rate and rhythm, no murmurs or gallops Breast:Examined in sitting and supine position were symmetrical in appearance, no palpable masses or tenderness,  no skin retraction, no nipple inversion, no nipple discharge, no skin discoloration, no axillary or supraclavicular lymphadenopathy Abdomen: no palpable masses or tenderness, no rebound or guarding Extremities: no edema or skin discoloration or tenderness  Pelvic: Vulva: Normal             Vagina: No gross lesions or discharge  Cervix: No gross lesions or discharge.  Strings felt at the exocervix.  Uterus  AV, normal size, shape and consistency, non-tender and mobile  Adnexa  Without masses or tenderness  Anus: Normal   Assessment/Plan:  42 y.o. female for annual exam   1. Well female exam with routine gynecological exam Normal gynecologic exam.  Pap test was negative with negative high-risk HPV November 2018.  Will repeat at 2 to 3 years.  Breast exam normal.  Screening mammogram in September 2019, right normal, left probably benign.  Will follow-up with a left diagnostic mammogram in 6 months.  Health labs with family physician.  2. Encounter for routine checking of intrauterine contraceptive device (IUD) Mirena IUD in good position and well-tolerated since March 2015.  Will come back in March 2025 to change to a new one.  3. Class 1 obesity due to excess calories without serious comorbidity with body mass index (BMI) of 32.0 to 32.9  in adult Recommend lower calorie/lower carb diet such as [employer].  Increase physical activity with aerobic activities such as fast-paced walking 5 times a week and weightlifting every 2 days.  4. Flu vaccine need  Other orders - Flu Vaccine QUAD 36+ mos IM (Fluarix, Quad PF)  Princess Bruins MD, 4:40 PM 02/11/2018

## 2018-05-21 ENCOUNTER — Ambulatory Visit
Admission: RE | Admit: 2018-05-21 | Discharge: 2018-05-21 | Disposition: A | Discharge status: Home / Self Care | Payer: BLUE CROSS/BLUE SHIELD | Source: Ambulatory Visit | Attending: Obstetrics & Gynecology | Admitting: Obstetrics & Gynecology

## 2018-05-21 DIAGNOSIS — N6012 Diffuse cystic mastopathy of left breast: Secondary | ICD-10-CM | POA: Diagnosis not present

## 2018-05-21 DIAGNOSIS — N6489 Other specified disorders of breast: Secondary | ICD-10-CM

## 2018-05-21 DIAGNOSIS — R928 Other abnormal and inconclusive findings on diagnostic imaging of breast: Secondary | ICD-10-CM | POA: Diagnosis not present

## 2018-07-10 ENCOUNTER — Telehealth: Payer: Self-pay | Admitting: *Deleted

## 2018-07-10 NOTE — Telephone Encounter (Signed)
Patient called and left message in voicemail requesting refill on Rx,,but didn't leave name of Rx. I called and asked her to call me.

## 2018-07-17 ENCOUNTER — Encounter (HOSPITAL_BASED_OUTPATIENT_CLINIC_OR_DEPARTMENT_OTHER): Payer: Self-pay | Admitting: *Deleted

## 2018-07-17 ENCOUNTER — Other Ambulatory Visit: Payer: Self-pay

## 2018-07-17 ENCOUNTER — Emergency Department (HOSPITAL_BASED_OUTPATIENT_CLINIC_OR_DEPARTMENT_OTHER)
Admission: EM | Admit: 2018-07-17 | Discharge: 2018-07-17 | Disposition: A | Discharge status: Home / Self Care | Payer: BLUE CROSS/BLUE SHIELD | Attending: Emergency Medicine | Admitting: Emergency Medicine

## 2018-07-17 ENCOUNTER — Ambulatory Visit: Payer: Self-pay

## 2018-07-17 ENCOUNTER — Emergency Department (HOSPITAL_BASED_OUTPATIENT_CLINIC_OR_DEPARTMENT_OTHER): Payer: BLUE CROSS/BLUE SHIELD

## 2018-07-17 DIAGNOSIS — Z79899 Other long term (current) drug therapy: Secondary | ICD-10-CM | POA: Insufficient documentation

## 2018-07-17 DIAGNOSIS — R0789 Other chest pain: Secondary | ICD-10-CM | POA: Insufficient documentation

## 2018-07-17 DIAGNOSIS — R079 Chest pain, unspecified: Secondary | ICD-10-CM

## 2018-07-17 DIAGNOSIS — R142 Eructation: Secondary | ICD-10-CM | POA: Diagnosis not present

## 2018-07-17 DIAGNOSIS — R0602 Shortness of breath: Secondary | ICD-10-CM | POA: Diagnosis not present

## 2018-07-17 LAB — CBC
HCT: 39.7 % (ref 36.0–46.0)
Hemoglobin: 13.2 g/dL (ref 12.0–15.0)
MCH: 30.7 pg (ref 26.0–34.0)
MCHC: 33.2 g/dL (ref 30.0–36.0)
MCV: 92.3 fL (ref 80.0–100.0)
Platelets: 280 10*3/uL (ref 150–400)
RBC: 4.3 MIL/uL (ref 3.87–5.11)
RDW: 12.5 % (ref 11.5–15.5)
WBC: 8.7 10*3/uL (ref 4.0–10.5)
nRBC: 0 % (ref 0.0–0.2)

## 2018-07-17 LAB — PREGNANCY, URINE: Preg Test, Ur: NEGATIVE

## 2018-07-17 LAB — COMPREHENSIVE METABOLIC PANEL
ALT: 21 U/L (ref 0–44)
AST: 18 U/L (ref 15–41)
Albumin: 3.9 g/dL (ref 3.5–5.0)
Alkaline Phosphatase: 51 U/L (ref 38–126)
Anion gap: 5 (ref 5–15)
BUN: 14 mg/dL (ref 6–20)
CO2: 24 mmol/L (ref 22–32)
Calcium: 9.1 mg/dL (ref 8.9–10.3)
Chloride: 110 mmol/L (ref 98–111)
Creatinine, Ser: 0.53 mg/dL (ref 0.44–1.00)
GFR calc Af Amer: >60 mL/min (ref >60)
GFR calc non Af Amer: >60 mL/min (ref >60)
Glucose, Bld: 93 mg/dL (ref 70–99)
Potassium: 3.9 mmol/L (ref 3.5–5.1)
Sodium: 139 mmol/L (ref 135–145)
Total Bilirubin: 0.4 mg/dL (ref 0.3–1.2)
Total Protein: 6.6 g/dL (ref 6.5–8.1)

## 2018-07-17 LAB — TROPONIN I
Troponin I: <0.03 ng/mL (ref <0.03)
Troponin I: <0.03 ng/mL (ref <0.03)

## 2018-07-17 LAB — LIPASE, BLOOD: Lipase: 33 U/L (ref 11–51)

## 2018-07-17 MED ORDER — LIDOCAINE VISCOUS HCL 2 % MT SOLN
15.0000 mL | Freq: Once | OROMUCOSAL | Status: AC
  Administered 2018-07-17: 15 mL via ORAL
  Filled 2018-07-17: qty 15

## 2018-07-17 MED ORDER — PANTOPRAZOLE SODIUM 40 MG PO TBEC
DELAYED_RELEASE_TABLET | ORAL | Status: AC
  Administered 2018-07-17: 40 mg via ORAL
  Filled 2018-07-17: qty 1

## 2018-07-17 MED ORDER — PANTOPRAZOLE SODIUM 40 MG PO TBEC
40.0000 mg | DELAYED_RELEASE_TABLET | Freq: Once | ORAL | Status: AC
  Administered 2018-07-17: 14:00:00 40 mg via ORAL

## 2018-07-17 MED ORDER — PANTOPRAZOLE SODIUM 20 MG PO TBEC: 20.0000 mg | DELAYED_RELEASE_TABLET | Freq: Two times a day (BID) | ORAL | 0 refills | Status: DC

## 2018-07-17 MED ORDER — ALUM & MAG HYDROXIDE-SIMETH 200-200-20 MG/5ML PO SUSP
30.0000 mL | Freq: Once | ORAL | Status: AC
  Administered 2018-07-17: 30 mL via ORAL
  Filled 2018-07-17: qty 30

## 2018-07-17 MED ORDER — PANTOPRAZOLE SODIUM 20 MG PO TBEC
20.0000 mg | DELAYED_RELEASE_TABLET | Freq: Once | ORAL | Status: DC
  Filled 2018-07-17: qty 1

## 2018-07-17 NOTE — Telephone Encounter (Signed)
Incoming call from Patient who state that she fells the sensation of left sided pressure under her left breast.   Feels like cold liquid burning.  Experienced for 3 days.  Denies radiation.  States it was coming and going .  Now constant.  Rates it moderate.  Denies cardiac and Pulmonary Risk factors.  Patient states she feels more anxious with the Sx.  Denies any other SX.  Patient uses IUD.  Reviewed protocol  And care advice.  Patient voiced understanding.  Will be going to [place] [facility]  Emergency Dept.    Reason for Disposition . [1] Chest pain lasts > 5 minutes AND [2] described as crushing, pressure-like, or heavy  Answer Assessment - Initial Assessment Questions 1. LOCATION: "Where does it hurt?"       Left side under breast.  2. RADIATION: "Does the pain go anywhere else?" (e.g., into neck, jaw, arms, back)     denies 3. ONSET: "When did the chest pain begin?" (Minutes, hours or days)       3days ago 4. PATTERN "Does the pain come and go, or has it been constant since it started?"  "Does it get worse with exertion?"      Comes and goes , now constant 5. DURATION: "How long does it last" (e.g., seconds, minutes, hours)     *No Answer* 6. SEVERITY: "How bad is the pain?"  (e.g., Scale 1-10; mild, moderate, or severe)    - MILD (1-3): doesn't interfere with normal activities     - MODERATE (4-7): interferes with normal activities or awakens from sleep    - SEVERE (8-10): excruciating pain, unable to do any normal activities       moderate 7. CARDIAC RISK FACTORS: "Do you have any history of heart problems or risk factors for heart disease?" (e.g., prior heart attack, angina; high blood pressure, diabetes, being overweight, high cholesterol, smoking, or strong family history of heart disease)     denies 8. PULMONARY RISK FACTORS: "Do you have any history of lung disease?"  (e.g., blood clots in lung, asthma, emphysema, birth control pills)     denies 9. CAUSE: "What do you think  is causing the chest pain?"     *No Answer* 10. OTHER SYMPTOMS: "Do you have any other symptoms?" (e.g., dizziness, nausea, vomiting, sweating, fever, difficulty breathing, cough)       More breathing anixious 11. PREGNANCY: "Is there any chance you are pregnant?" "When was your last menstrual period?"         IUd  Protocols used: CHEST PAIN-A-AH

## 2018-07-17 NOTE — Discharge Instructions (Signed)
Please read and follow all provided instructions.  Your diagnoses today include:  1. Left-sided chest pain   2. Eructation     Tests performed today include: An EKG of your heart A chest x-ray Cardiac enzymes - a blood test for heart muscle damage Blood counts and electrolytes Vital signs. See below for your results today.   Medications prescribed:   Take any prescribed medications only as directed.  Please start taking Protonix twice a day.  I would like to start with 2 weeks of this medication to be reassessed by her primary care provider after the end of this treatment.  Please do a trial of reducing your Bentyl use in case it is increasing GERD.  Follow-up instructions: Please follow-up with your primary care provider as soon as you can for further evaluation of your symptoms.   Return instructions:  SEEK IMMEDIATE MEDICAL ATTENTION IF: You have severe chest pain, especially if the pain is crushing or pressure-like and spreads to the arms, back, neck, or jaw, or if you have sweating, nausea (feeling sick to your stomach), or shortness of breath. THIS IS AN EMERGENCY. Don't wait to see if the pain will go away. Get medical help at once. Call 911 or 0 (operator). DO NOT drive yourself to the hospital.  Your chest pain gets worse and does not go away with rest.  You have an attack of chest pain lasting longer than usual, despite rest and treatment with the medications your caregiver has prescribed.  You wake from sleep with chest pain or shortness of breath. You feel dizzy or faint. You have chest pain not typical of your usual pain for which you originally saw your caregiver.  You have any other emergent concerns regarding your health.  Additional Information: Chest pain comes from many different causes. Your caregiver has diagnosed you as having chest pain that is not specific for one problem, but does not require admission.  You are at low risk for an acute heart condition or  other serious illness.   Your vital signs today were: BP 108/74    Pulse 74    Temp 98.3 F (36.8 C) (Oral)    Resp 20    Ht 5\' 8"  (1.727 m)    Wt 96.2 kg    SpO2 97%    BMI 32.26 kg/m  If your blood pressure (BP) was elevated above 135/85 this visit, please have this repeated by your doctor within one month. --------------

## 2018-07-17 NOTE — ED Provider Notes (Signed)
[place] EMERGENCY DEPARTMENT Provider Note   CSN: [id number] Arrival date & time: 07/17/18  1305    History   Chief Complaint No chief complaint on file.   HPI Tracie Riley is a 43 y.o. female.     HPI  Patient is a 61-year female past medical history of asthma, IBS, frequent headaches presenting for left-sided chest discomfort.  Patient reports that she has had intermittent sensation of bloating and heaviness in the left side of her chest as well as a burning sensation over the past 3 days, however last night it was present and did not go away this morning.  Patient ports that did not wake her up from sleep.  She denies any exertional chest pain.  She denies any shortness of breath related to her symptoms.  This morning she felt that she was bloated and had excessive burping, but did not feel nauseous, or diaphoretic.  Chest pain has been nonradiating.  Patient reports that she has a history of IBS and takes Bentyl, and unsure if she has an extensive history of GERD.  She does have a gastroenterologist who previously wanted to perform endoscopy, however patient decided to ultimately declined this.  Patient does not have a family history of early cardiovascular disease.  Patient is a smoker approximately 8 cigarettes/day x 2 decades.  No history of hypertension or hyperlipidemia.  No history of DVT/PE, hormone use, cancer treatment, recent mobilization, hospitalization, recent surgery, lower extremity edema or calf tenderness or hemoptysis.  Past Medical History:  Diagnosis Date  . Asthma   . Back pain  - chornic neck and back pain, sees ortho ([facility]), had neg rheum workup per her report 05/19/2014  . Frequent headaches 05/19/2014    Patient Active Problem List   Diagnosis Date Noted  . Bereavment, Anxiety 08/19/2014  . BMI 32.0-32.9,adult 08/19/2014  . Back pain  - chornic neck and back pain, sees ortho ([facility]), had neg rheum workup per her report 05/19/2014  .  Frequent headaches 05/19/2014  . Tobacco use disorder 05/19/2014  . Peri-menopause 01/07/2013    History reviewed. No pertinent surgical history.   OB History    Gravida  3   Para  2   Term  2   Preterm  0   AB  1   Living  2     SAB  1   TAB  0   Ectopic  0   Multiple  0   Live Births               Home Medications    Prior to Admission medications   Medication Sig Start Date End Date Taking? Authorizing Provider  albuterol (PROVENTIL HFA;VENTOLIN HFA) 108 (90 Base) MCG/ACT inhaler Inhale 2 puffs into the lungs every 6 (six) hours as needed. 11/18/17  Yes Lucretia Kern, DO  dicyclomine (BENTYL) 10 MG capsule Take 1 capsule (10 mg total) by mouth 3 (three) times daily before meals. 07/01/17  Yes Ladene Artist, MD  levonorgestrel (MIRENA) 20 MCG/24HR IUD 1 each by Intrauterine route once.   Yes [provider]    Family History Family History  Problem Relation Age of Onset  . Rheum arthritis Mother   . Hepatitis C Mother   . Alcohol abuse Mother   . Cancer Mother        lung, smoker  . Alcohol abuse Father        deceased    Social History Social History  Tobacco Use  . Smoking status: Current Every Day Smoker    Packs/day: 0.50    Types: Cigarettes  . Smokeless tobacco: Never Used  Substance Use Topics  . Alcohol use: Yes    Comment: WINE ON THE WEEKEND  . Drug use: No     Allergies   Patient has no known allergies.   Review of Systems Review of Systems  Constitutional: Negative for chills and fever.  HENT: Negative for congestion and sore throat.   Eyes: Negative for visual disturbance.  Respiratory: Negative for cough, chest tightness and shortness of breath.   Cardiovascular: Positive for chest pain. Negative for palpitations and leg swelling.  Gastrointestinal: Negative for abdominal pain, nausea and vomiting.  Genitourinary: Negative for dysuria and flank pain.  Musculoskeletal: Negative for back pain and myalgias.   Skin: Negative for rash.  Neurological: Negative for dizziness, syncope and light-headedness.     Physical Exam Updated Vital Signs BP 113/65   Pulse 76   Temp 98.3 F (36.8 C) (Oral)   Resp 20   Ht 5\' 8"  (1.727 m)   Wt 96.2 kg   SpO2 98%   BMI 32.26 kg/m   Physical Exam Vitals signs and nursing note reviewed.  Constitutional:      General: She is not in acute distress.    Appearance: She is well-developed.  HENT:     Head: Normocephalic and atraumatic.     Mouth/Throat:     Mouth: Mucous membranes are moist.  Eyes:     Conjunctiva/sclera: Conjunctivae normal.     Pupils: Pupils are equal, round, and reactive to light.  Neck:     Musculoskeletal: Normal range of motion and neck supple.  Cardiovascular:     Rate and Rhythm: Normal rate and regular rhythm.     Pulses:          Radial pulses are 2+ on the right side and 2+ on the left side.       Dorsalis pedis pulses are 2+ on the right side and 2+ on the left side.     Heart sounds: S1 normal and S2 normal. No murmur.     Comments: No LE edema or calf TTP.  Pulmonary:     Effort: Pulmonary effort is normal.     Breath sounds: Normal breath sounds. No wheezing or rales.  Abdominal:     General: There is no distension.     Palpations: Abdomen is soft.     Tenderness: There is no abdominal tenderness. There is no guarding.  Musculoskeletal: Normal range of motion.        General: No deformity.  Lymphadenopathy:     Cervical: No cervical adenopathy.  Skin:    General: Skin is warm and dry.     Findings: No erythema or rash.  Neurological:     Mental Status: She is alert.     Comments: Cranial nerves grossly intact. Patient moves extremities symmetrically and with good coordination.  Psychiatric:        Behavior: Behavior normal.        Thought Content: Thought content normal.        Judgment: Judgment normal.      ED Treatments / Results  Labs (all labs ordered are listed, but only abnormal results are  displayed) Labs Reviewed  CBC  TROPONIN I  PREGNANCY, URINE  COMPREHENSIVE METABOLIC PANEL  LIPASE, BLOOD  TROPONIN I    EKG  ED ECG REPORT   Date: 07/17/2018  Rate: 69   Rhythm: normal sinus rhythm  QRS Axis: normal  Intervals: normal  ST/T Wave abnormalities: normal  Conduction Disutrbances:none  Narrative Interpretation:   Old EKG Reviewed: none available  I have personally reviewed the EKG tracing and agree with the computerized printout as noted. Confirmed by Dr. Virgel Manifold in [place].   Radiology Dg Chest 2 View  Result Date: 07/17/2018 CLINICAL DATA:  Chest pain, shortness of breath. EXAM: CHEST - 2 VIEW COMPARISON:  Radiographs of April 06, 2008. FINDINGS: The heart size and mediastinal contours are within normal limits. Both lungs are clear. No pneumothorax or pleural effusion is noted. The visualized skeletal structures are unremarkable. IMPRESSION: No active cardiopulmonary disease. Electronically Signed   By: Marijo Conception M.D.   On: 07/17/2018 14:20    Procedures Procedures (including critical care time)  Medications Ordered in ED Medications  alum & mag hydroxide-simeth (MAALOX/MYLANTA) 200-200-20 MG/5ML suspension 30 mL (30 mLs Oral Given 07/17/18 1411)    And  lidocaine (XYLOCAINE) 2 % viscous mouth solution 15 mL (15 mLs Oral Given 07/17/18 1411)  pantoprazole (PROTONIX) EC tablet 40 mg (40 mg Oral Given 07/17/18 1411)     Initial Impression / Assessment and Plan / ED Course  I have reviewed the triage vital signs and the nursing notes.  Pertinent labs & imaging results that were available during my care of the patient were reviewed by me and considered in my medical decision making (see chart for details).  Clinical Course as of Jul 16 1720  Fri Jul 17, 2018  1613 Pt still reporting ongoing pain but significantly improved. Will delta trop and if negative, pt can follow as OP.    [AM]    Clinical Course User Index [AM] Langston Masker B, PA-C        Differential diagnosis includes ACS, PE, thoracic aortic dissection, Boerhaave's syndrome, cardiac tamponade, pneumothorax, incarcerated diaphragmatic hernia, cholecystitis, esophageal spasm, gastroesophageal reflux, herpes zoster of the thorax, pericarditis, pneumonia, chest wall pain, costochondritis.   Doubt ACS, as delta troponin is negative, initial EKG shows no signs of ischemia, infarction, or arrhythmia, and HEART score 1 (smoking history). Doubt PE as Well's score 0 and PERC negative, and patient not tachycardic and has no shortness of breath. Doubt TAD by hx, CXR showed no widening mediastinum, and pulses equal in all extremities. Patient remained nontoxic appearing and in no acute distress during emergency department course. Vital signs stable in the emergency department. Therefore, doubt esophageal rupture, cardiac tamponade, or pneumothorax. Pericarditis less likely due to no preceding infectious symptoms and pain not improved in upright positions. No abnormal labs.  Suspect that there may be a GI nature to patient symptoms that she experiences them worse at night, and she has an extensive history of GI disease.  Will start Protonix and refer back to gastroenterology and primary care.  Patient to return precautions for any worsening pain, shortness of breath, dizziness, lightheadedness, or new or worsening symptoms.  This is a supervised visit with Dr. Virgel Manifold. Evaluation, management, and discharge planning discussed with this attending physician.  Final Clinical Impressions(s) / ED Diagnoses   Final diagnoses:  Left-sided chest pain  Eructation    ED Discharge Orders         Ordered    pantoprazole (PROTONIX) 20 MG tablet  2 times daily     07/17/18 1736    Ambulatory referral to Gastroenterology    Comments:  Patient previously went to [facility].   07/17/18  [address]           Tamala Julian 07/17/18 Rayvon Char, MD 07/18/18 [phone]

## 2018-07-17 NOTE — ED Provider Notes (Signed)
EKG:  Rhythm: normal sinus Rate: 69 PR: 172 ms QRS: 100 ms QTc: 426 ms ST segments: normal    Virgel Manifold, MD 07/17/18 1515

## 2018-07-17 NOTE — ED Triage Notes (Signed)
3 days of burning in her left chest. Burping gives her relief. She had a pinching feeling today. States she feels anxious. She has had diarrhea x 3 days. Hx of "stomach issues".

## 2018-07-19 NOTE — Telephone Encounter (Signed)
Please schedule follow up with me in 1-2 weeks. Thanks.

## 2018-07-21 NOTE — Telephone Encounter (Signed)
I left a message for the pt to return my call. 

## 2018-07-23 NOTE — Telephone Encounter (Signed)
I left a message for the pt to return my call. 

## 2018-07-28 NOTE — Telephone Encounter (Signed)
I left a detailed message for the pt to return my call to schedule a follow up phone visit.

## 2018-07-29 ENCOUNTER — Other Ambulatory Visit: Payer: Self-pay

## 2018-07-30 ENCOUNTER — Encounter: Payer: Self-pay | Admitting: Obstetrics & Gynecology

## 2018-07-30 ENCOUNTER — Ambulatory Visit (INDEPENDENT_AMBULATORY_CARE_PROVIDER_SITE_OTHER): Payer: BLUE CROSS/BLUE SHIELD | Admitting: Obstetrics & Gynecology

## 2018-07-30 VITALS — BP 122/76

## 2018-07-30 DIAGNOSIS — A6000 Herpesviral infection of urogenital system, unspecified: Secondary | ICD-10-CM

## 2018-07-30 DIAGNOSIS — Z8619 Personal history of other infectious and parasitic diseases: Secondary | ICD-10-CM | POA: Diagnosis not present

## 2018-07-30 DIAGNOSIS — Z30433 Encounter for removal and reinsertion of intrauterine contraceptive device: Secondary | ICD-10-CM

## 2018-07-30 MED ORDER — VALACYCLOVIR HCL 1 G PO TABS: 1000.0000 mg | ORAL_TABLET | Freq: Every day | ORAL | 2 refills | Status: AC

## 2018-07-30 NOTE — Progress Notes (Signed)
Tracie Riley July 01, 1975 [id number]        43 y.o.  [id number]  Married  RP: Mirena removal/insertion  HPI: Mirena IUD since March 2015.  Time to remove.  Patient doing very well on it and desires a new Mirena IUD insertion today.  No pelvic pain.  Light menses every 1 to 2 months.  Normal vaginal secretions.  No fever.  Patient has had more frequent recurrences of genital herpes in the last year, about 3-4 episodes.  Asymptomatic currently.  Would like to have a prescription of valacyclovir for recurrences.   OB History  Gravida Para Term Preterm AB Living  3 2 2  0 1 2  SAB TAB Ectopic Multiple Live Births  1 0 0 0      # Outcome Date GA Lbr Len/2nd Weight Sex Delivery Anes PTL Lv  3 SAB           2 Term           1 Term             Past medical history,surgical history, problem list, medications, allergies, family history and social history were all reviewed and documented in the EPIC chart.   Directed ROS with pertinent positives and negatives documented in the history of present illness/assessment and plan.  Exam:  Vitals:   07/30/18 0927  BP: 122/76   General appearance:  Normal                                                                    IUD procedure note       Patient presented to the office today for removal and placement of a Mirena IUD. The patient had previously been provided with literature information on this method of contraception. The risks benefits and pros and cons were discussed and all her questions were answered. She is fully aware that this form of contraception is 99% effective and is good for 5 years.  Pelvic exam: Vulva normal Vagina: No lesions or discharge Cervix: No lesions or discharge.  IUD strings visible at the Exo-Cervix.  Strings grasped with a Boseman clamp, easy removal by pulling on strings.  IUD intact/complete, shown to patient and discarded.  Removal well tolerated, no Cx. Uterus: AV position.   Adnexa: No masses or  tenderness Rectal exam: Not done  The cervix was cleansed with Betadine solution. Hurricane spray on the cervix. The IUD was shown to the patient and inserted in a sterile fashion.  Hysterometry with the IUD as being inserted was 7 cm.  The IUD string was trimmed. Patient was instructed to return back to the office in one month for follow up.        Assessment/Plan:  43 y.o. [id number]   1. Encounter for IUD removal and reinsertion Easily removal of Mirena IUD without complication.  Easy insertion of Mirena IUD without complication.  Well-tolerated.  Precautions discussed with patient.  We will follow-up in 4 weeks to confirm good placement and no infection.  2. H/O herpes genitalis More frequent episodes of recurrent genital herpes in the last year, 3-4 recurrences.  Patient prefers to treat recurrences rather than use prophylaxis at this time.  Prescription of valacyclovir 1 g/tab. 1 tablet per mouth  daily for 5 days for recurrent genital herpes.  Usage reviewed and prescription sent to pharmacy.  Other orders - valACYclovir (VALTREX) 1000 MG tablet; Take 1 tablet (1,000 mg total) by mouth daily for 5 days.  Counseling on above issues and coordination of care more than 50% for 15 minutes.  Princess Bruins MD, 9:54 AM 07/30/2018

## 2018-07-30 NOTE — Patient Instructions (Signed)
1. Encounter for IUD removal and reinsertion Easily removal of Mirena IUD without complication.  Easy insertion of Mirena IUD without complication.  Well-tolerated.  Precautions discussed with patient.  We will follow-up in 4 weeks to confirm good placement and no infection.  2. H/O herpes genitalis More frequent episodes of recurrent genital herpes in the last year, 3-4 recurrences.  Patient prefers to treat recurrences rather than use prophylaxis at this time.  Prescription of valacyclovir 1 g/tab. 1 tablet per mouth daily for 5 days for recurrent genital herpes.  Usage reviewed and prescription sent to pharmacy.  Other orders - valACYclovir (VALTREX) 1000 MG tablet; Take 1 tablet (1,000 mg total) by mouth daily for 5 days.  Tracie Riley, it was a pleasure seeing you today!

## 2018-09-15 ENCOUNTER — Ambulatory Visit (INDEPENDENT_AMBULATORY_CARE_PROVIDER_SITE_OTHER): Payer: BC Managed Care – PPO | Admitting: Family Medicine

## 2018-09-15 ENCOUNTER — Encounter: Payer: Self-pay | Admitting: Family Medicine

## 2018-09-15 ENCOUNTER — Other Ambulatory Visit: Payer: Self-pay

## 2018-09-15 VITALS — BP 130/78 | HR 83 | Temp 98.3°F | Ht 67.0 in | Wt 215.3 lb

## 2018-09-15 DIAGNOSIS — H9202 Otalgia, left ear: Secondary | ICD-10-CM

## 2018-09-15 MED ORDER — CIPRO HC 0.2-1 % OT SUSP: 3.0000 [drp] | Freq: Two times a day (BID) | OTIC | 0 refills | Status: DC

## 2018-09-15 NOTE — Patient Instructions (Signed)
Keep water out of ear as much as possible.    Touch base in one week if not improving on the antibiotic drops.

## 2018-09-15 NOTE — Progress Notes (Signed)
  Subjective:     Patient ID: Tracie Riley, female   DOB: 1976-01-30, 43 y.o.   MRN: [id number]  HPI  Patient seen with left earache.  This has been somewhat intermittent for possibly years.  No recent swimming.  No hearing changes.  She has had some intermittent vertigo in the past but not necessarily related to this.  No ringing.  No sore throat.  No fevers or chills.  Occasional clear drainage.  Occasional allergy symptoms but not consistently.  No clear triggers.  She tried some hydrogen peroxide a couple of times without much improvement  Past Medical History:  Diagnosis Date  . Asthma   . Back pain  - chornic neck and back pain, sees ortho ([facility]), had neg rheum workup per her report 05/19/2014  . Frequent headaches 05/19/2014   History reviewed. No pertinent surgical history.  reports that she has been smoking cigarettes. She has been smoking about 0.50 packs per day. She has never used smokeless tobacco. She reports current alcohol use. She reports that she does not use drugs. family history includes Alcohol abuse in her father and mother; Cancer in her mother; Hepatitis C in her mother; Rheum arthritis in her mother. No Known Allergies   Review of Systems  Constitutional: Negative for chills and fever.  HENT: Positive for ear pain. Negative for ear discharge, sinus pressure, sinus pain and sore throat.        Objective:   Physical Exam Constitutional:      Appearance: Normal appearance.  HENT:     Ears:     Comments: Right eardrum and canal appear normal.  Left canal reveals some erythema along the inferior portion but eardrum appears normal.  No perforation.  No exudate. Neck:     Musculoskeletal: Neck supple.  Lymphadenopathy:     Cervical: No cervical adenopathy.  Neurological:     Mental Status: She is alert.        Assessment:     Recurrent left otalgia.  Only finding on exam is some mild erythema along the inferior portion of the ear canal    Plan:      -.  Keep ear dry  -We recommend trial of Cipro HC 3 drops twice daily and touch base if not improving in 1 to 2 weeks  Eulas Post MD  Primary Care at [facility]

## 2018-09-17 ENCOUNTER — Encounter: Payer: Self-pay | Admitting: Anesthesiology

## 2018-10-01 ENCOUNTER — Other Ambulatory Visit: Payer: Self-pay | Admitting: Family Medicine

## 2018-10-02 NOTE — Telephone Encounter (Signed)
Not sure if Dr. Maudie Mercury wants to fill w/o follow up?

## 2018-10-12 ENCOUNTER — Telehealth: Payer: Self-pay

## 2018-10-12 NOTE — Telephone Encounter (Signed)
Left voice message for patient to call clinic to see if Rx was needed due to being prescribed on 09/15/18

## 2018-11-03 DIAGNOSIS — L820 Inflamed seborrheic keratosis: Secondary | ICD-10-CM | POA: Diagnosis not present

## 2019-02-17 ENCOUNTER — Ambulatory Visit: Payer: BC Managed Care – PPO | Admitting: Gastroenterology

## 2019-02-17 ENCOUNTER — Other Ambulatory Visit: Payer: Self-pay

## 2019-02-17 ENCOUNTER — Encounter: Payer: Self-pay | Admitting: Obstetrics & Gynecology

## 2019-02-17 ENCOUNTER — Encounter: Payer: Self-pay | Admitting: Gastroenterology

## 2019-02-17 ENCOUNTER — Ambulatory Visit (INDEPENDENT_AMBULATORY_CARE_PROVIDER_SITE_OTHER): Payer: BC Managed Care – PPO | Admitting: Obstetrics & Gynecology

## 2019-02-17 VITALS — BP 124/80 | Ht 68.0 in | Wt 218.0 lb

## 2019-02-17 VITALS — BP 102/64 | HR 74 | Temp 98.0°F | Ht 68.0 in | Wt 218.4 lb

## 2019-02-17 DIAGNOSIS — Z01419 Encounter for gynecological examination (general) (routine) without abnormal findings: Secondary | ICD-10-CM

## 2019-02-17 DIAGNOSIS — E6609 Other obesity due to excess calories: Secondary | ICD-10-CM | POA: Diagnosis not present

## 2019-02-17 DIAGNOSIS — Z23 Encounter for immunization: Secondary | ICD-10-CM

## 2019-02-17 DIAGNOSIS — R197 Diarrhea, unspecified: Secondary | ICD-10-CM

## 2019-02-17 DIAGNOSIS — Z6833 Body mass index (BMI) 33.0-33.9, adult: Secondary | ICD-10-CM

## 2019-02-17 DIAGNOSIS — R140 Abdominal distension (gaseous): Secondary | ICD-10-CM | POA: Diagnosis not present

## 2019-02-17 DIAGNOSIS — Z30431 Encounter for routine checking of intrauterine contraceptive device: Secondary | ICD-10-CM | POA: Diagnosis not present

## 2019-02-17 MED ORDER — PANTOPRAZOLE SODIUM 40 MG PO TBEC: 40.0000 mg | DELAYED_RELEASE_TABLET | Freq: Every day | ORAL | 11 refills | Status: DC

## 2019-02-17 MED ORDER — DICYCLOMINE HCL 10 MG PO CAPS: 10.0000 mg | ORAL_CAPSULE | Freq: Three times a day (TID) | ORAL | 11 refills | Status: DC

## 2019-02-17 NOTE — Patient Instructions (Signed)
We have sent the following medications to your pharmacy for you to pick up at your convenience: pantoprazole and dicyclomine.  You have been given a low-fodmap diet to follow.   Thank you for choosing me and [facility].  Pricilla Riffle. Dagoberto Ligas., MD., Marval Regal

## 2019-02-17 NOTE — Progress Notes (Signed)
[place] 1975-10-13 [id number]   History:    43 y.o. G3P2A1L2 Married  RP:  Established patient presenting for annual gyn exam   HPI: Well on Mirena IUD x 07/2018.  No abnormal bleeding.  No pelvic pain.  No pain with intercourse.Urine and bowel movements normal.  Breasts normal.  Body mass index 33.15.  Needs to exercise more frequently.  Health labs with family physician.  Past medical history,surgical history, family history and social history were all reviewed and documented in the EPIC chart.  Gynecologic History No LMP recorded. (Menstrual status: IUD). Contraception: Mirena IUD x 07/2018 Last Pap: 01/2017. Results were: Negative/HPV HR negative Last mammogram: 05/2018. Results were: Probably benign Bone Density: Never Colonoscopy: Never  Obstetric History OB History  Gravida Para Term Preterm AB Living  3 2 2  0 1 2  SAB TAB Ectopic Multiple Live Births  1 0 0 0      # Outcome Date GA Lbr Len/2nd Weight Sex Delivery Anes PTL Lv  3 SAB           2 Term           1 Term              ROS: A ROS was performed and pertinent positives and negatives are included in the history.  GENERAL: No fevers or chills. HEENT: No change in vision, no earache, sore throat or sinus congestion. NECK: No pain or stiffness. CARDIOVASCULAR: No chest pain or pressure. No palpitations. PULMONARY: No shortness of breath, cough or wheeze. GASTROINTESTINAL: No abdominal pain, nausea, vomiting or diarrhea, melena or bright red blood per rectum. GENITOURINARY: No urinary frequency, urgency, hesitancy or dysuria. MUSCULOSKELETAL: No joint or muscle pain, no back pain, no recent trauma. DERMATOLOGIC: No rash, no itching, no lesions. ENDOCRINE: No polyuria, polydipsia, no heat or cold intolerance. No recent change in weight. HEMATOLOGICAL: No anemia or easy bruising or bleeding. NEUROLOGIC: No headache, seizures, numbness, tingling or weakness. PSYCHIATRIC: No depression, no loss of interest in normal  activity or change in sleep pattern.     Exam:   BP 124/80 (BP Location: Right Arm, Patient Position: Sitting, Cuff Size: Normal)   Ht 5\' 8"  (1.727 m)   Wt 218 lb (98.9 kg)   BMI 33.15 kg/m   Body mass index is 33.15 kg/m.  General appearance : Well developed well nourished female. No acute distress HEENT: Eyes: no retinal hemorrhage or exudates,  Neck supple, trachea midline, no carotid bruits, no thyroidmegaly Lungs: Clear to auscultation, no rhonchi or wheezes, or rib retractions  Heart: Regular rate and rhythm, no murmurs or gallops Breast:Examined in sitting and supine position were symmetrical in appearance, no palpable masses or tenderness,  no skin retraction, no nipple inversion, no nipple discharge, no skin discoloration, no axillary or supraclavicular lymphadenopathy Abdomen: no palpable masses or tenderness, no rebound or guarding Extremities: no edema or skin discoloration or tenderness  Pelvic: Vulva: Normal             Vagina: No gross lesions or discharge  Cervix: No gross lesions or discharge.  IUD strings visible at the EO.  Uterus  AV, normal size, shape and consistency, non-tender and mobile  Adnexa  Without masses or tenderness  Anus: Normal   Assessment/Plan:  43 y.o. female for annual exam   1. Well female exam with routine gynecological exam Normal gynecologic exam.  Pap test in November 2018 was negative with negative high-risk HPV, will repeat a Pap  test next year.  Breast exam normal.  Screening mammogram March 2020 was probably benign.  Health labs with family physician.  2. Encounter for routine checking of intrauterine contraceptive device (IUD) Mirena IUD since May 2020.  Well-tolerated and in good position.  3. Class 1 obesity due to excess calories without serious comorbidity with body mass index (BMI) of 33.0 to 33.9 in adult Recommend a lower calorie/carb diet such as [employer].  Aerobic physical activities 5 times a week and weight  lifting every 2 days.  4. Need for immunization against influenza - Flu Vaccine QUAD 36+ mos IM  Princess Bruins MD, 4:33 PM 02/17/2019

## 2019-02-17 NOTE — Progress Notes (Signed)
    History of Present Illness: This is a 43 year old female who relates ongoing problems with postprandial urgent diarrhea, generalized abdominal bloating, abdominal discomfort and frequent heartburn.  She was evaluated previously in April 2019.  Celiac panel was negative in February 2019.  Ranitidine and dicyclomine were prescribed at that time and she relates symptom improved.  Colonoscopy was recommended last year however she did not follow through.  She clearly notes lactose intolerance.  She states many other foods lead to diarrhea and she is unsure which foods are triggering.  She relates 20 pound weight gain over past year. Denies weight loss, constipation, change in stool caliber, melena, hematochezia, nausea, vomiting, dysphagia, chest pain.   Current Medications, Allergies, Past Medical History, Past Surgical History, Family History and Social History were reviewed in [employer] record.   Physical Exam: General: Well developed, well nourished, no acute distress Head: Normocephalic and atraumatic Eyes:  sclerae anicteric, EOMI Ears: Normal auditory acuity Mouth: No deformity or lesions Lungs: Clear throughout to auscultation Heart: Regular rate and rhythm; no murmurs, rubs or bruits Abdomen: Soft, mild generalized tenderness and non distended. No masses, hepatosplenomegaly or hernias noted. Normal Bowel sounds Rectal: Not done Musculoskeletal: Symmetrical with no gross deformities  Pulses:  Normal pulses noted Extremities: No clubbing, cyanosis, edema or deformities noted Neurological: Alert oriented x 4, grossly nonfocal Psychological:  Alert and cooperative. Anxious.    Assessment and Recommendations:  1.  Postprandial diarrhea, abdominal bloating, abdominal pain.  Suspected IBS-D, food intolerances.  Rule out IBD.  Begin low FODMAP diet.  Begin dicyclomine 10 mg before meals and at bedtime.  She states she will consider colonoscopy if symptoms not well  controlled over the next several weeks.  Consider abdominal imaging pending response to above treatment.  REV in 6 weeks.   2.  GERD.  Follow standard antireflux measures.  Pantoprazole 40 mg daily.  Tums as needed.  Consider EGD if symptoms not adequately controlled.

## 2019-02-22 ENCOUNTER — Encounter: Payer: Self-pay | Admitting: Obstetrics & Gynecology

## 2019-02-22 NOTE — Patient Instructions (Signed)
1. Well female exam with routine gynecological exam Normal gynecologic exam.  Pap test in November 2018 was negative with negative high-risk HPV, will repeat a Pap test next year.  Breast exam normal.  Screening mammogram March 2020 was probably benign.  Health labs with family physician.  2. Encounter for routine checking of intrauterine contraceptive device (IUD) Mirena IUD since May 2020.  Well-tolerated and in good position.  3. Class 1 obesity due to excess calories without serious comorbidity with body mass index (BMI) of 33.0 to 33.9 in adult Recommend a lower calorie/carb diet such as [employer].  Aerobic physical activities 5 times a week and weight lifting every 2 days.  4. Need for immunization against influenza - Flu Vaccine QUAD 36+ mos IM  Maury, it was a pleasure seeing you today!

## 2019-04-02 ENCOUNTER — Encounter: Payer: Self-pay | Admitting: Gastroenterology

## 2019-04-05 ENCOUNTER — Ambulatory Visit: Payer: BC Managed Care – PPO | Admitting: Gastroenterology

## 2019-04-26 ENCOUNTER — Telehealth: Payer: Self-pay | Admitting: Family Medicine

## 2019-04-26 NOTE — Telephone Encounter (Signed)
Called to sch TOC, left VM

## 2019-04-26 NOTE — Telephone Encounter (Signed)
-----   Message from Lucretia Kern, DO sent at 02/23/2019 11:31 AM EST ----- Looks like patient needs in office PCP. Please see who she would like to see. I am happy to see her for any virtual visits if she sticks with a [place] PCP. Thanks. ----- Message ----- From: Princess Bruins, MD Sent: 02/22/2019   9:53 PM EST To: Lucretia Kern, DO

## 2019-04-29 ENCOUNTER — Other Ambulatory Visit: Payer: Self-pay

## 2019-04-29 ENCOUNTER — Ambulatory Visit (AMBULATORY_SURGERY_CENTER): Payer: Self-pay | Admitting: *Deleted

## 2019-04-29 VITALS — Temp 97.3°F | Ht 68.0 in | Wt 221.8 lb

## 2019-04-29 DIAGNOSIS — R197 Diarrhea, unspecified: Secondary | ICD-10-CM

## 2019-04-29 DIAGNOSIS — Z01818 Encounter for other preprocedural examination: Secondary | ICD-10-CM

## 2019-04-29 MED ORDER — SUPREP BOWEL PREP KIT 17.5-3.13-1.6 GM/177ML PO SOLN: 1.0000 | Freq: Once | ORAL | 0 refills | Status: AC

## 2019-04-29 NOTE — Progress Notes (Signed)
Patient denies any allergies to egg or soy products. Patient has not had any surgical procedures/anesthesia.  Patient denies oxygen use at home and denies diet medications. Emmi instructions for colonoscopy/endoscopy explained and given to patient.

## 2019-05-10 ENCOUNTER — Ambulatory Visit (INDEPENDENT_AMBULATORY_CARE_PROVIDER_SITE_OTHER): Payer: BC Managed Care – PPO

## 2019-05-10 ENCOUNTER — Other Ambulatory Visit: Payer: Self-pay | Admitting: Gastroenterology

## 2019-05-10 ENCOUNTER — Other Ambulatory Visit: Payer: Self-pay

## 2019-05-10 ENCOUNTER — Encounter: Payer: Self-pay | Admitting: Gastroenterology

## 2019-05-10 DIAGNOSIS — Z1159 Encounter for screening for other viral diseases: Secondary | ICD-10-CM

## 2019-05-11 LAB — SARS CORONAVIRUS 2 (TAT 6-24 HRS): SARS Coronavirus 2: NEGATIVE

## 2019-05-13 ENCOUNTER — Ambulatory Visit (AMBULATORY_SURGERY_CENTER): Payer: BC Managed Care – PPO | Admitting: Gastroenterology

## 2019-05-13 ENCOUNTER — Other Ambulatory Visit: Payer: Self-pay

## 2019-05-13 ENCOUNTER — Encounter: Payer: Self-pay | Admitting: Gastroenterology

## 2019-05-13 VITALS — BP 113/63 | HR 57 | Temp 97.1°F | Resp 21 | Ht 68.0 in | Wt 221.8 lb

## 2019-05-13 DIAGNOSIS — K621 Rectal polyp: Secondary | ICD-10-CM

## 2019-05-13 DIAGNOSIS — R197 Diarrhea, unspecified: Secondary | ICD-10-CM | POA: Diagnosis not present

## 2019-05-13 DIAGNOSIS — D128 Benign neoplasm of rectum: Secondary | ICD-10-CM

## 2019-05-13 DIAGNOSIS — D124 Benign neoplasm of descending colon: Secondary | ICD-10-CM

## 2019-05-13 MED ORDER — SODIUM CHLORIDE 0.9 % IV SOLN: 500.0000 mL | Freq: Once | INTRAVENOUS | Status: DC

## 2019-05-13 NOTE — Patient Instructions (Signed)
Information on polyps given to you today.  Await pathology results.  Return to GI clinic in 4 weeks.  YOU HAD AN ENDOSCOPIC PROCEDURE TODAY AT [facility]:   Refer to the procedure report that was given to you for any specific questions about what was found during the examination.  If the procedure report does not answer your questions, please call your gastroenterologist to clarify.  If you requested that your care partner not be given the details of your procedure findings, then the procedure report has been included in a sealed envelope for you to review at your convenience later.  YOU SHOULD EXPECT: Some feelings of bloating in the abdomen. Passage of more gas than usual.  Walking can help get rid of the air that was put into your GI tract during the procedure and reduce the bloating. If you had a lower endoscopy (such as a colonoscopy or flexible sigmoidoscopy) you may notice spotting of blood in your stool or on the toilet paper. If you underwent a bowel prep for your procedure, you may not have a normal bowel movement for a few days.  Please Note:  You might notice some irritation and congestion in your nose or some drainage.  This is from the oxygen used during your procedure.  There is no need for concern and it should clear up in a day or so.  SYMPTOMS TO REPORT IMMEDIATELY:   Following lower endoscopy (colonoscopy or flexible sigmoidoscopy):  Excessive amounts of blood in the stool  Significant tenderness or worsening of abdominal pains  Swelling of the abdomen that is new, acute  Fever of 100F or higher   For urgent or emergent issues, a gastroenterologist can be reached at any hour by calling [phone].   DIET:  We do recommend a small meal at first, but then you may proceed to your regular diet.  Drink plenty of fluids but you should avoid alcoholic beverages for 24 hours.  ACTIVITY:  You should plan to take it easy for the rest of today and you should  NOT DRIVE or use heavy machinery until tomorrow (because of the sedation medicines used during the test).    FOLLOW UP: Our staff will call the number listed on your records 48-72 hours following your procedure to check on you and address any questions or concerns that you may have regarding the information given to you following your procedure. If we do not reach you, we will leave a message.  We will attempt to reach you two times.  During this call, we will ask if you have developed any symptoms of COVID 19. If you develop any symptoms (ie: fever, flu-like symptoms, shortness of breath, cough etc.) before then, please call [phone].  If you test positive for Covid 19 in the 2 weeks post procedure, please call and report this information to [place].    If any biopsies were taken you will be contacted by phone or by letter within the next 1-3 weeks.  Please call us at [phone] if you have not heard about the biopsies in 3 weeks.    SIGNATURES/CONFIDENTIALITY: You and/or your care partner have signed paperwork which will be entered into your electronic medical record.  These signatures attest to the fact that that the information above on your After Visit Summary has been reviewed and is understood.  Full responsibility of the confidentiality of this discharge information lies with you and/or your care-partner.

## 2019-05-13 NOTE — Progress Notes (Signed)
Called to room to assist during endoscopic procedure.  Patient ID and intended procedure confirmed with present staff. Received instructions for my participation in the procedure from the performing physician.  

## 2019-05-13 NOTE — Op Note (Signed)
[place] Patient Name: Tracie Riley Procedure Date: 05/13/2019 8:51 AM MRN: [id number] Endoscopist: Ladene Artist , MD Age: 44 Referring MD:  Date of Birth: [birth year] Gender: Female Account #: [id number] Procedure:                Colonoscopy Indications:              Clinically significant diarrhea of unexplained                            origin Medicines:                Monitored Anesthesia Care Procedure:                Pre-Anesthesia Assessment:                           - Prior to the procedure, a History and Physical                            was performed, and patient medications and                            allergies were reviewed. The patient's tolerance of                            previous anesthesia was also reviewed. The risks                            and benefits of the procedure and the sedation                            options and risks were discussed with the patient.                            All questions were answered, and informed consent                            was obtained. Prior Anticoagulants: The patient has                            taken no previous anticoagulant or antiplatelet                            agents. ASA Grade Assessment: II - A patient with                            mild systemic disease. After reviewing the risks                            and benefits, the patient was deemed in                            satisfactory condition to undergo the procedure.  After obtaining informed consent, the colonoscope                            was passed under direct vision. Throughout the                            procedure, the patient's blood pressure, pulse, and                            oxygen saturations were monitored continuously. The                            Colonoscope was introduced through the anus and                            advanced to the the terminal ileum, with              identification of the appendiceal orifice and IC                            valve. The terminal ileum, ileocecal valve,                            appendiceal orifice, and rectum were photographed.                            The quality of the bowel preparation was excellent.                            The colonoscopy was performed without difficulty.                            The patient tolerated the procedure well. Scope In: 9:01:08 AM Scope Out: 9:17:29 AM Scope Withdrawal Time: 0 hours 14 minutes 1 second  Total Procedure Duration: 0 hours 16 minutes 21 seconds  Findings:                 The perianal and digital rectal examinations were                            normal.                           The terminal ileum appeared normal.                           Three sessile polyps were found in the rectum (2)                            and descending colon (1). The polyps were 6 to 7 mm                            in size. These polyps were removed with a cold  snare. Resection and retrieval were complete.                           The exam was otherwise without abnormality on                            direct and retroflexion views. Random biopsies                            obtained throughout the colon. Complications:            No immediate complications. Estimated blood loss:                            None. Estimated Blood Loss:     Estimated blood loss: none. Impression:               - The examined portion of the ileum was normal.                           - Three 6 to 7 mm polyps in the rectum and in the                            descending colon, removed with a cold snare.                            Resected and retrieved.                           - The examination was otherwise normal on direct                            and retroflexion views. Random biopsies obtained                            throughout the colon. Recommendation:            - Repeat colonoscopy after studies are complete for                            surveillance based on pathology results.                           - Patient has a contact number available for                            emergencies. The signs and symptoms of potential                            delayed complications were discussed with the                            patient. Return to normal activities tomorrow.                            Written discharge instructions  were provided to the                            patient.                           - Resume previous diet.                           - Continue present medications.                           - Await pathology results.                           - Return to GI office in 4 weeks. Ladene Artist, MD 05/13/2019 9:22:29 AM This report has been signed electronically.

## 2019-05-13 NOTE — Progress Notes (Signed)
Report to PACU, RN, vss, BBS= Clear.  

## 2019-05-13 NOTE — Progress Notes (Signed)
VS- Butch Penny Thoimas Temp- Lisa Clapps  Pt's states no medical or surgical changes since previsit or office visit.

## 2019-05-17 ENCOUNTER — Telehealth: Payer: Self-pay

## 2019-05-17 ENCOUNTER — Encounter: Payer: Self-pay | Admitting: Gastroenterology

## 2019-05-17 ENCOUNTER — Telehealth: Payer: Self-pay | Admitting: Gastroenterology

## 2019-05-17 NOTE — Telephone Encounter (Signed)
Patient is having bloating and heartburn.  She is to have a 4 week follow up with Dr. Fuller Plan s/p colonoscopy.  She will come discuss with him on 4/1.  She is offered an earlier appt with APP.  She declines for now.  She will call back if she changes her mind.

## 2019-05-17 NOTE — Telephone Encounter (Signed)
Pt called returning call and states she is feeling fine.

## 2019-05-17 NOTE — Telephone Encounter (Signed)
Left message on follow up call. 

## 2019-05-17 NOTE — Telephone Encounter (Signed)
Attempted to reach pt. With follow-up call following endoscopic procedure 05/13/19.  LM on pt. Ans. Machine.  Will try to reach pt. Again later today.

## 2019-06-10 ENCOUNTER — Ambulatory Visit: Payer: BC Managed Care – PPO | Attending: Internal Medicine

## 2019-06-10 DIAGNOSIS — Z23 Encounter for immunization: Secondary | ICD-10-CM

## 2019-06-10 NOTE — Progress Notes (Signed)
   Covid-19 Vaccination Clinic  Name:  Tracie Riley    MRN: [id number] DOB: 01-06-1976  06/10/2019  [name] was observed post Covid-19 immunization for 15 minutes without incident. She was provided with Vaccine Information Sheet and instruction to access the V-Safe system.   [name] was instructed to call 911 with any severe reactions post vaccine: Marland Kitchen Difficulty breathing  . Swelling of face and throat  . A fast heartbeat  . A bad rash all over body  . Dizziness and weakness   Immunizations Administered    Name Date Dose VIS Date Route   Pfizer COVID-19 Vaccine 06/10/2019  3:50 PM 0.3 mL 02/26/2019 Intramuscular   Manufacturer: [employer]   Lot: [id number]   [place]: [id number]

## 2019-06-11 ENCOUNTER — Ambulatory Visit: Payer: BC Managed Care – PPO

## 2019-06-17 ENCOUNTER — Ambulatory Visit: Payer: BC Managed Care – PPO | Admitting: Gastroenterology

## 2019-07-05 ENCOUNTER — Ambulatory Visit: Payer: BC Managed Care – PPO | Attending: Internal Medicine

## 2019-07-05 DIAGNOSIS — Z23 Encounter for immunization: Secondary | ICD-10-CM

## 2019-07-05 NOTE — Progress Notes (Signed)
   Covid-19 Vaccination Clinic  Name:  Tracie Riley    MRN: [id number] DOB: 1976/01/06  07/05/2019  [name] was observed post Covid-19 immunization for 15 minutes without incident. She was provided with Vaccine Information Sheet and instruction to access the V-Safe system.   [name] was instructed to call 911 with any severe reactions post vaccine: Marland Kitchen Difficulty breathing  . Swelling of face and throat  . A fast heartbeat  . A bad rash all over body  . Dizziness and weakness   Immunizations Administered    Name Date Dose VIS Date Route   Pfizer COVID-19 Vaccine 07/05/2019  1:30 PM 0.3 mL 05/12/2018 Intramuscular   Manufacturer: [employer]   Lot: [id number]   [place]: [id number]

## 2019-07-12 ENCOUNTER — Encounter: Payer: Self-pay | Admitting: Gastroenterology

## 2019-07-12 ENCOUNTER — Ambulatory Visit: Payer: BC Managed Care – PPO | Admitting: Gastroenterology

## 2019-07-12 VITALS — BP 110/64 | HR 88 | Temp 97.7°F | Ht 66.75 in | Wt 217.4 lb

## 2019-07-12 DIAGNOSIS — E739 Lactose intolerance, unspecified: Secondary | ICD-10-CM | POA: Insufficient documentation

## 2019-07-12 DIAGNOSIS — K219 Gastro-esophageal reflux disease without esophagitis: Secondary | ICD-10-CM

## 2019-07-12 DIAGNOSIS — R140 Abdominal distension (gaseous): Secondary | ICD-10-CM | POA: Diagnosis not present

## 2019-07-12 MED ORDER — DICYCLOMINE HCL 20 MG PO TABS: 20.0000 mg | ORAL_TABLET | Freq: Three times a day (TID) | ORAL | 11 refills | Status: DC

## 2019-07-12 NOTE — Patient Instructions (Signed)
Increase your dicylomine to 20 mg four times a day before meals and at bedtime. A new prescription has been sent to your pharmacy.   Call our office back in 2 weeks if you see no improvement in your symptoms.   Thank you for choosing me and [facility].  Pricilla Riffle. Dagoberto Ligas., MD., Marval Regal

## 2019-07-12 NOTE — Progress Notes (Signed)
    History of Present Illness: This is a 44 year old female with diarrhea, abdominal bloating, GERD.  After discontinuing milk products she no longer has diarrhea.  She does note frequent postprandial bloating with larger meals but not with snacks.  Dicyclomine is somewhat helpful.  She has occasional heartburn.  - The examined portion of the ileum was normal. - Three 6 to 7 mm polyps in the rectum and in the descending colon, removed with a cold snare. Resected and retrieved. Path; normal mucosa - The examination was otherwise normal on direct and retroflexion views. Random biopsies obtained throughout the colon. Path: normal mucosa   Current Medications, Allergies, Past Medical History, Past Surgical History, Family History and Social History were reviewed in [employer] record.   Physical Exam: General: Well developed, well nourished, no acute distress Head: Normocephalic and atraumatic Eyes:  sclerae anicteric, EOMI Ears: Normal auditory acuity Mouth: Not examined, mask on during Covid-19 pandemic Lungs: Clear throughout to auscultation Heart: Regular rate and rhythm; no murmurs, rubs or bruits Abdomen: Soft, non tender and non distended. No masses, hepatosplenomegaly or hernias noted. Normal Bowel sounds Rectal: Not done Musculoskeletal: Symmetrical with no gross deformities  Pulses:  Normal pulses noted Extremities: No clubbing, cyanosis, edema or deformities noted Neurological: Alert oriented x 4, grossly nonfocal Psychological:  Alert and cooperative. Normal mood and affect   Assessment and Recommendations:  1.  Postprandial bloating, suspected IBS.  Avoid foods that exacerbate symptoms.  Increase dicyclomine to 20 mg p.o. before meals and at bedtime.  She is advised to call in 2 to 3 weeks if symptoms not well controlled.  Discussed proceeding with EGD and abdominal ultrasound if symptoms are not well controlled.   2. Lactose intolerance causing  diarrhea.  Continue to avoid all lactose products.  3. GERD.  Follow antireflux measures and continue pantoprazole 40 mg daily.

## 2019-08-17 DIAGNOSIS — Z20822 Contact with and (suspected) exposure to covid-19: Secondary | ICD-10-CM | POA: Diagnosis not present

## 2019-08-19 DIAGNOSIS — J069 Acute upper respiratory infection, unspecified: Secondary | ICD-10-CM | POA: Diagnosis not present

## 2019-08-19 DIAGNOSIS — R05 Cough: Secondary | ICD-10-CM | POA: Diagnosis not present

## 2019-08-20 DIAGNOSIS — Z20822 Contact with and (suspected) exposure to covid-19: Secondary | ICD-10-CM | POA: Diagnosis not present

## 2019-08-23 ENCOUNTER — Telehealth (INDEPENDENT_AMBULATORY_CARE_PROVIDER_SITE_OTHER): Payer: BC Managed Care – PPO | Admitting: Family Medicine

## 2019-08-23 DIAGNOSIS — J019 Acute sinusitis, unspecified: Secondary | ICD-10-CM | POA: Diagnosis not present

## 2019-08-23 MED ORDER — AMOXICILLIN-POT CLAVULANATE 875-125 MG PO TABS: 1.0000 | ORAL_TABLET | Freq: Two times a day (BID) | ORAL | 0 refills | Status: DC

## 2019-08-23 NOTE — Progress Notes (Signed)
Patient ID: Tracie Riley, female   DOB: 1975/07/09, 44 y.o.   MRN: [id number]   This visit type was conducted due to national recommendations for restrictions regarding the COVID-19 pandemic in an effort to limit this patient's exposure and mitigate transmission in our community.   Virtual Visit via Video Note  I connected with Tracie Riley on 08/23/19 at  3:00 PM EDT by a video enabled telemedicine application and verified that I am speaking with the correct person using two identifiers.  Location patient: home Location provider:work or home office Persons participating in the virtual visit: patient, provider  I discussed the limitations of evaluation and management by telemedicine and the availability of in person appointments. The patient expressed understanding and agreed to proceed.   HPI:  Ragena called with onset about a week ago of some sore throat, nasal congestion, intermittent headaches, cough, body aches.  She also has developed some eye symptoms.  She had itchy eyes but also some mild crusting.  She states that her daughter had been recently diagnosed with Covid.  She went to [facility] and had flu and Covid testing which were negative.  She has had her Covid vaccines.  She has tried over-the-counter Mucinex and Afrin.  She does have some upper teeth pain on the left side and thick green to yellow nasal discharge.  No dyspnea.  ROS: See pertinent positives and negatives per HPI.  Past Medical History:  Diagnosis Date  . Asthma   . Back pain  - chornic neck and back pain, sees ortho ([facility]), had neg rheum workup per her report 05/19/2014  . Frequent headaches 05/19/2014  . GERD (gastroesophageal reflux disease)     No past surgical history on file.  Family History  Problem Relation Age of Onset  . Rheum arthritis Mother   . Hepatitis C Mother   . Alcohol abuse Mother   . Cancer Mother        lung, smoker  . Alcohol abuse Father        deceased  . Colon cancer Neg Hx   .  Rectal cancer Neg Hx   . Stomach cancer Neg Hx     SOCIAL HX: History of smoking   Current Outpatient Medications:  .  albuterol (PROVENTIL HFA;VENTOLIN HFA) 108 (90 Base) MCG/ACT inhaler, Inhale 2 puffs into the lungs every 6 (six) hours as needed. (Patient not taking: Reported on 04/29/2019), Disp: 1 Inhaler, Rfl: 0 .  amoxicillin-clavulanate (AUGMENTIN) 875-125 MG tablet, Take 1 tablet by mouth 2 (two) times daily., Disp: 20 tablet, Rfl: 0 .  dicyclomine (BENTYL) 20 MG tablet, Take 1 tablet (20 mg total) by mouth 4 (four) times daily -  before meals and at bedtime., Disp: 120 tablet, Rfl: 11 .  levonorgestrel (MIRENA) 20 MCG/24HR IUD, 1 each by Intrauterine route once., Disp: , Rfl:  .  pantoprazole (PROTONIX) 40 MG tablet, Take 1 tablet (40 mg total) by mouth daily., Disp: 30 tablet, Rfl: 11 .  valACYclovir (VALTREX) 1000 MG tablet, Take 1,000 mg by mouth as needed. , Disp: , Rfl:   EXAM:  VITALS per patient if applicable:  GENERAL: alert, oriented, appears well and in no acute distress  HEENT: atraumatic, conjunttiva clear, no obvious abnormalities on inspection of external nose and ears  NECK: normal movements of the head and neck  LUNGS: on inspection no signs of respiratory distress, breathing rate appears normal, no obvious gross SOB, gasping or wheezing  CV: no obvious cyanosis  MS: moves all  visible extremities without noticeable abnormality  PSYCH/NEURO: pleasant and cooperative, no obvious depression or anxiety, speech and thought processing grossly intact  ASSESSMENT AND PLAN:  Discussed the following assessment and plan:  Progressive URI symptoms.  Recent Covid testing negative.  With upper teeth pain and symptom complex question acute sinusitis  -We agreed to start Augmentin 875 mg twice daily with food -Stay well-hydrated -Limit Afrin use to no more than 3 days -Continue over-the-counter Mucinex -Touch base if not improving over the next couple of days      I discussed the assessment and treatment plan with the patient. The patient was provided an opportunity to ask questions and all were answered. The patient agreed with the plan and demonstrated an understanding of the instructions.   The patient was advised to call back or seek an in-person evaluation if the symptoms worsen or if the condition fails to improve as anticipated.     Carolann Littler, MD

## 2019-09-14 ENCOUNTER — Ambulatory Visit: Payer: BC Managed Care – PPO | Admitting: Gastroenterology

## 2019-11-30 IMAGING — MG DIGITAL DIAGNOSTIC BILATERAL MAMMOGRAM WITH TOMO AND CAD
8 series · 8 of 24 positions shown · non-contrast
Comparison: Previous exam(s).

CLINICAL DATA: 42-year-old female presenting for follow-up of
probably benign bilateral findings.

EXAM:
DIGITAL DIAGNOSTIC BILATERAL MAMMOGRAM WITH CAD AND TOMO
ULTRASOUND LEFT BREAST

[L MLO synth-2D]
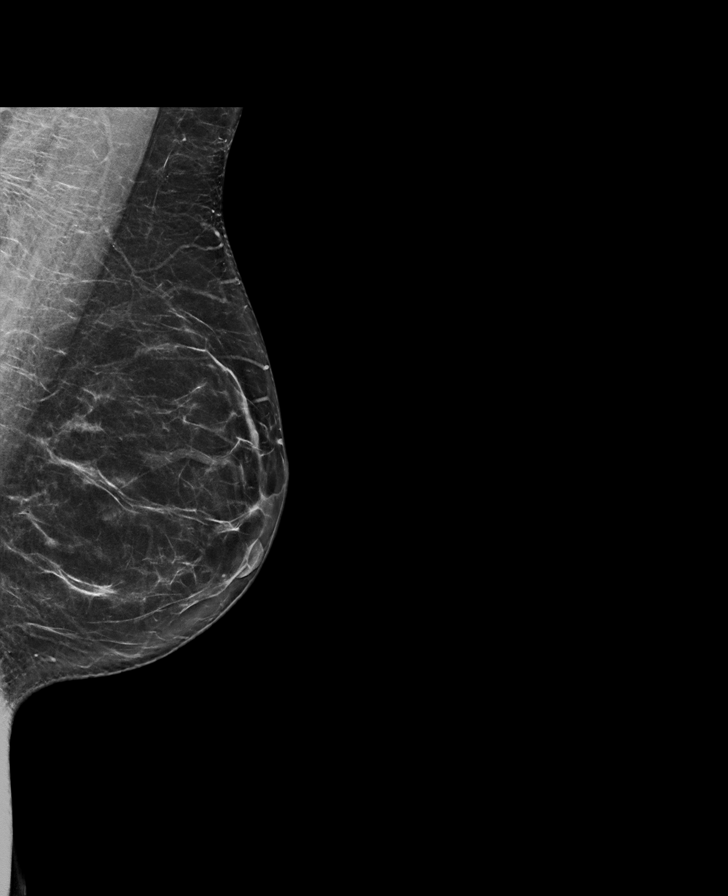

[R MLO synth-2D]
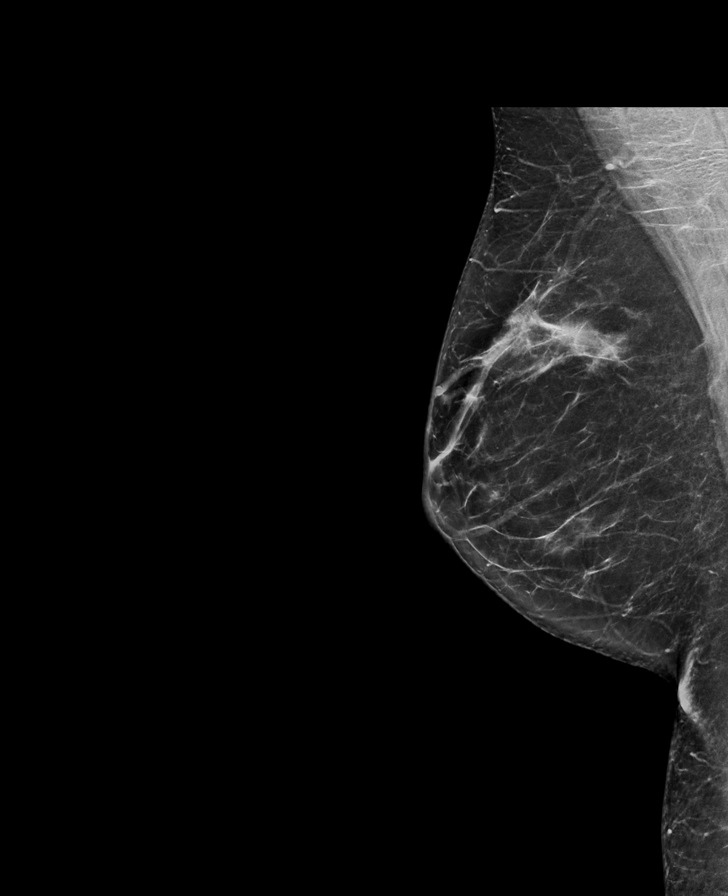

[L CC synth-2D]
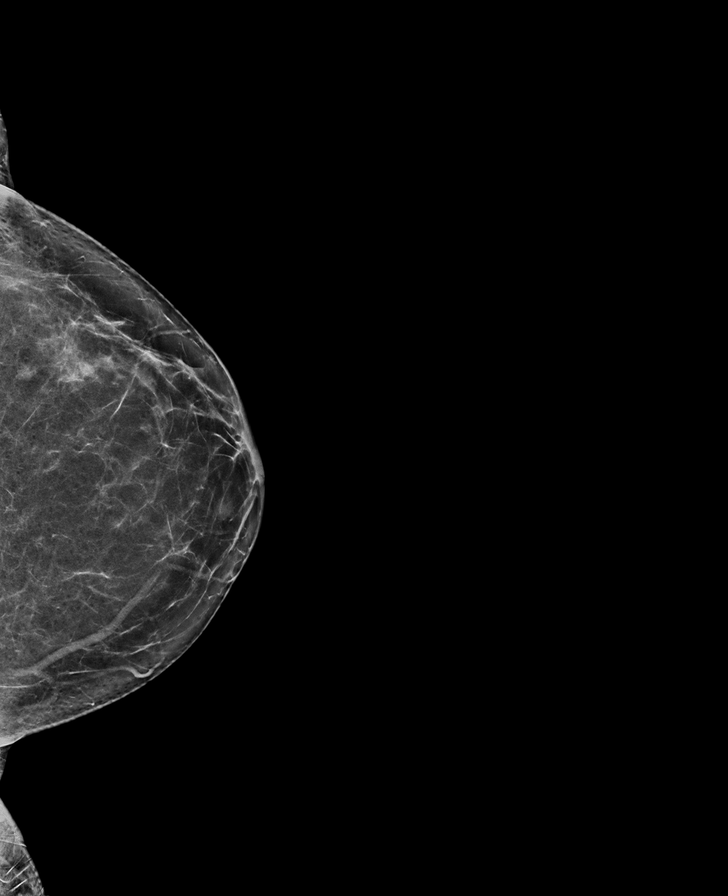

[R CC synth-2D]
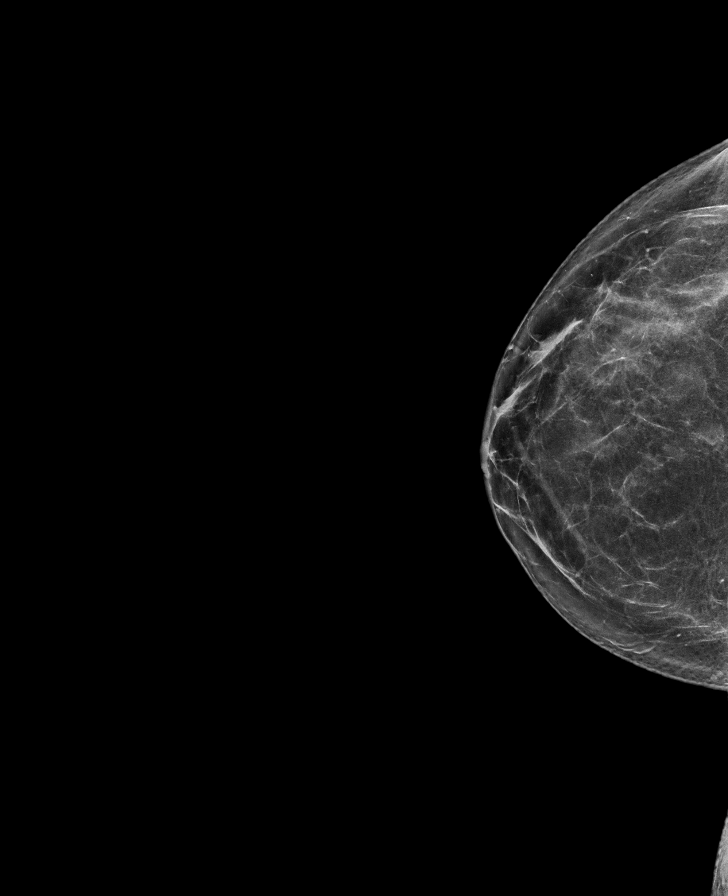

[L MLO tomo · tomo slice 37/74.0]
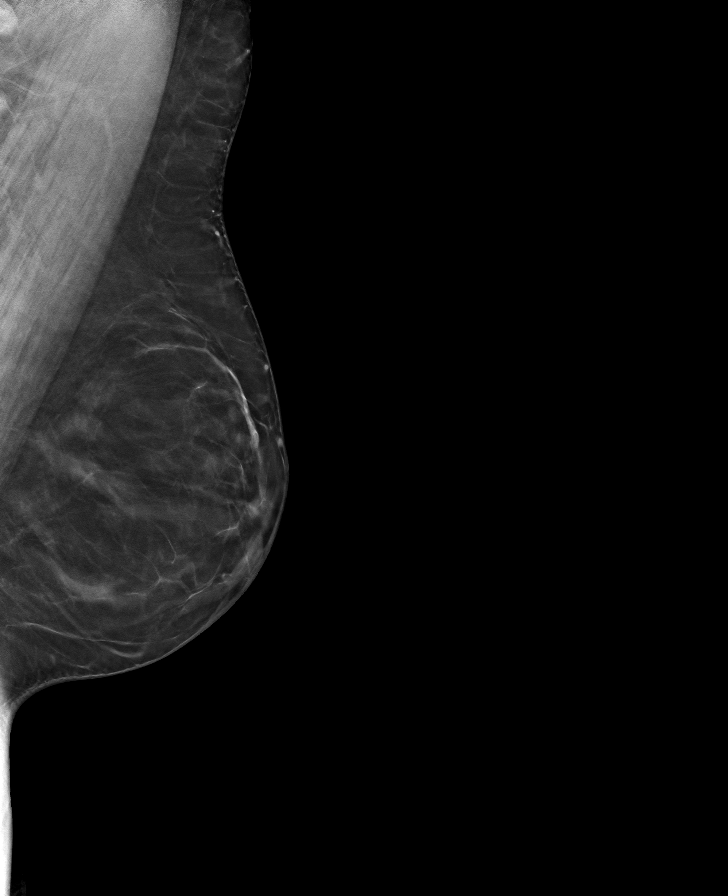

[R MLO tomo · tomo slice 38/75.0]
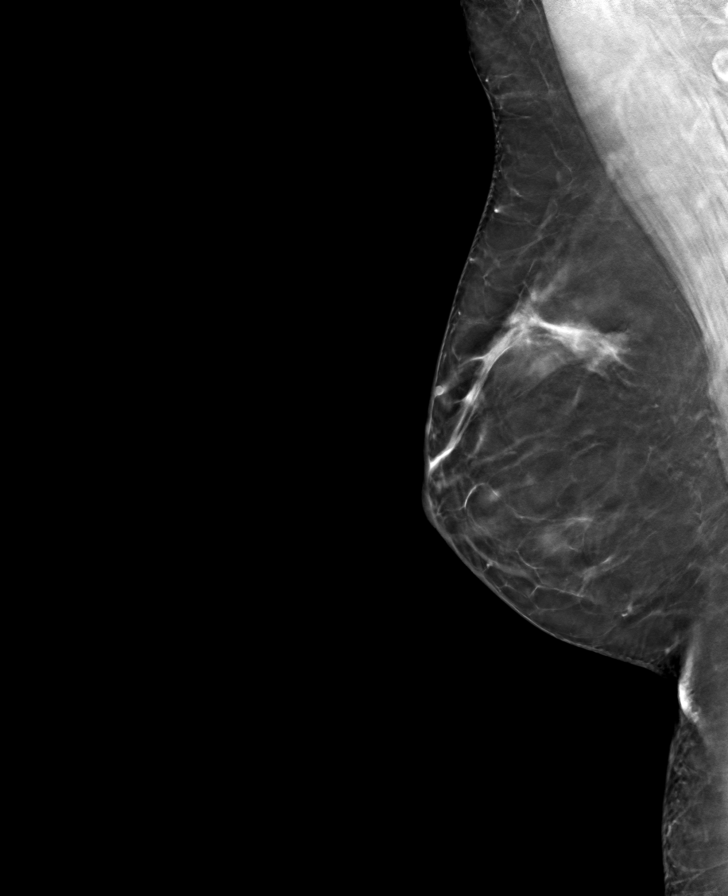

[L CC tomo · tomo slice 35/69.0]
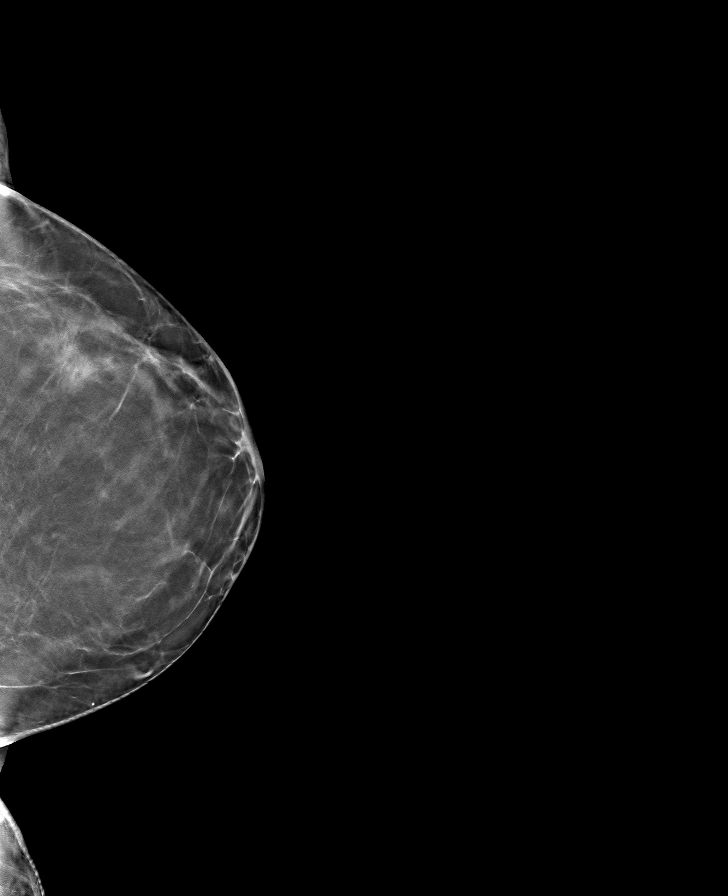

[R CC tomo · tomo slice 37/73.0]
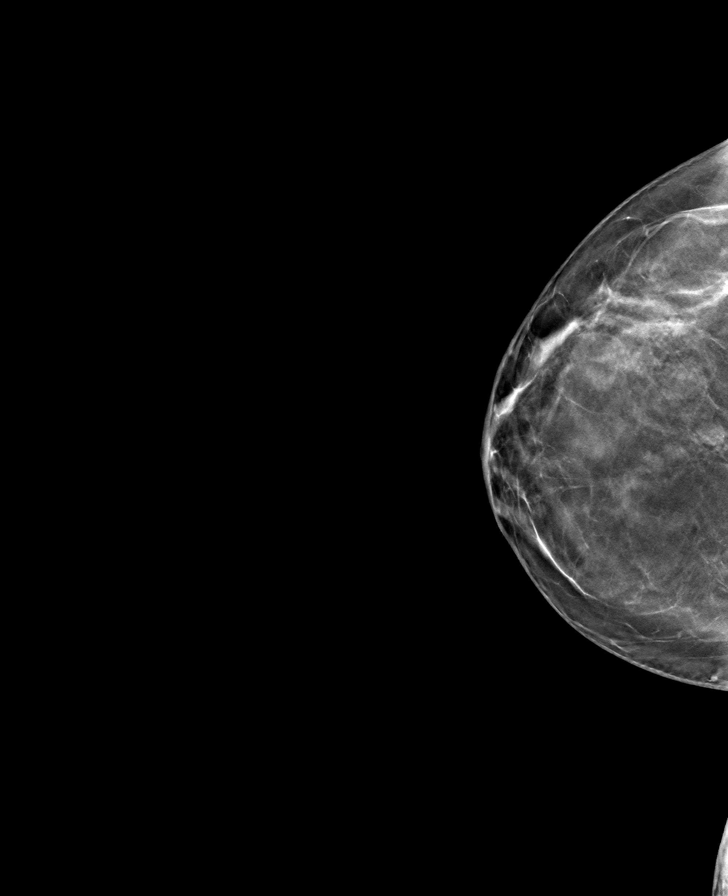

[8 of 24 positions shown; findings below may reference images not displayed]

ACR Breast Density Category b: There are scattered areas of
fibroglandular density.
FINDINGS: An asymmetry in the superior right breast at posterior depth on the
CC projection appears mammographically stable compared to prior
study. No additional suspicious findings are identified on the
right.

Within the left breast, an irregular mass is again identified in the
lateral aspect at far posterior depth. This too is mammographically
stable, without clear correlate on the MLO projection.

Mammographic images were processed with CAD.

Targeted ultrasound is performed, showing stable appearance of an
oval hypoechoic mass at the 4 o'clock position 4 cm from the nipple.
It measures 7 x 5 x 3 mm. An additional benign 4 mm cyst is also
unchanged.
IMPRESSION: 1. Stable, probably benign right breast asymmetry likely
representing focal fibroglandular tissue. Recommendation is for
continued short-term follow-up.
2. Stable, probably benign probable left breast cyst cluster.
Recommendation is for continued short-term follow-up.

RECOMMENDATION:
Bilateral diagnostic mammogram and left breast ultrasound in 6
months.

I have discussed the findings and recommendations with the patient.
Results were also provided in writing at the conclusion of the
visit. If applicable, a reminder letter will be sent to the patient
regarding the next appointment.

BI-RADS CATEGORY  3: Probably benign.

## 2020-02-05 ENCOUNTER — Other Ambulatory Visit: Payer: Self-pay | Admitting: Obstetrics & Gynecology

## 2020-02-18 ENCOUNTER — Encounter: Payer: BC Managed Care – PPO | Admitting: Obstetrics & Gynecology

## 2020-03-21 ENCOUNTER — Telehealth: Payer: Self-pay | Admitting: Gastroenterology

## 2020-03-21 MED ORDER — PANTOPRAZOLE SODIUM 40 MG PO TBEC: 40.0000 mg | DELAYED_RELEASE_TABLET | Freq: Every day | ORAL | 5 refills | Status: DC

## 2020-03-21 NOTE — Telephone Encounter (Signed)
Patient needs to refill Pantoprazole. Please send it to [facility] pharmacy on [address].

## 2020-03-21 NOTE — Telephone Encounter (Signed)
Prescription refill sent to patient's pharmacy

## 2020-03-23 ENCOUNTER — Telehealth: Payer: BC Managed Care – PPO | Admitting: Family Medicine

## 2020-03-23 DIAGNOSIS — L989 Disorder of the skin and subcutaneous tissue, unspecified: Secondary | ICD-10-CM

## 2020-03-23 DIAGNOSIS — R21 Rash and other nonspecific skin eruption: Secondary | ICD-10-CM

## 2020-03-23 MED ORDER — MUPIROCIN 2 % EX OINT: 1.0000 "application " | TOPICAL_OINTMENT | Freq: Two times a day (BID) | CUTANEOUS | 0 refills | Status: DC

## 2020-03-23 NOTE — Progress Notes (Signed)
Virtual Visit via Video Note  I connected with Tracie Riley  on 03/23/20 at  1:20 PM EST by a video enabled telemedicine application and verified that I am speaking with the correct person using two identifiers.  Location patient: home, [place] Location provider:work or home office Persons participating in the virtual visit: patient, provider  I discussed the limitations of evaluation and management by telemedicine and the availability of in person appointments. The patient expressed understanding and agreed to proceed.   HPI:  Acute telemedicine visit for : -Onset: 2 months ago -Symptoms include: R side near rib cage - itchy patchy of scaly skin -Denies: similar rash elsewhere, oozing, illness, pets, skin rash on other members of household or close contacts -Has tried: hydrocortisone and lotromin (but sporadically) -sees Dr. Allyson Sabal in dermatology and is planning to see him as well  Also with a d few scattered papules on wrists/ankes recently. Some formed small pustules then resolved. This is insect bites.   ROS: See pertinent positives and negatives per HPI.  Past Medical History:  Diagnosis Date  . Asthma   . Back pain  - chornic neck and back pain, sees ortho ([facility]), had neg rheum workup per her report 05/19/2014  . Frequent headaches 05/19/2014  . GERD (gastroesophageal reflux disease)     No past surgical history on file.   Current Outpatient Medications:  .  mupirocin ointment (BACTROBAN) 2 %, Apply 1 application topically 2 (two) times daily., Disp: 22 g, Rfl: 0 .  albuterol (PROVENTIL HFA;VENTOLIN HFA) 108 (90 Base) MCG/ACT inhaler, Inhale 2 puffs into the lungs every 6 (six) hours as needed. (Patient not taking: Reported on 04/29/2019), Disp: 1 Inhaler, Rfl: 0 .  amoxicillin-clavulanate (AUGMENTIN) 875-125 MG tablet, Take 1 tablet by mouth 2 (two) times daily., Disp: 20 tablet, Rfl: 0 .  dicyclomine (BENTYL) 20 MG tablet, Take 1 tablet (20 mg total) by mouth 4 (four) times daily -   before meals and at bedtime., Disp: 120 tablet, Rfl: 11 .  levonorgestrel (MIRENA) 20 MCG/24HR IUD, 1 each by Intrauterine route once., Disp: , Rfl:  .  pantoprazole (PROTONIX) 40 MG tablet, Take 1 tablet (40 mg total) by mouth daily., Disp: 30 tablet, Rfl: 5 .  valACYclovir (VALTREX) 1000 MG tablet, TAKE 1 TABLET BY MOUTH DAILY FOR 5 DAYS, Disp: 15 tablet, Rfl: 4  EXAM:  VITALS per patient if applicable:  GENERAL: alert, oriented, appears well and in no acute distress  HEENT: atraumatic, conjunttiva clear, no obvious abnormalities on inspection of external nose and ears  NECK: normal movements of the head and neck  LUNGS: on inspection no signs of respiratory distress, breathing rate appears normal, no obvious gross SOB, gasping or wheezing  CV: no obvious cyanosis  SKIN: video quality is not great - can see circular patch of hyperpigmentation with what looks like scaly border and central clearing on R lateral trunk. A few scattered erythematous papules on the wrist/ankle  MS: moves all visible extremities without noticeable abnormality  PSYCH/NEURO: pleasant and cooperative, no obvious depression or anxiety, speech and thought processing grossly intact  ASSESSMENT AND PLAN:  Discussed the following assessment and plan:  Skin rash  Skin lesion  -we discussed possible serious and likely etiologies, options for evaluation and workup, limitations of telemedicine visit vs in person visit, treatment, treatment risks and precautions. Pt prefers to treat via telemedicine empirically rather than in person at this moment.  Discussed potential etiologies of the lesion on the right flank, most likely fungal  versus eczema, psoriasis, viral or other.  Opted to try consistent use of topical antifungal with close follow up in person with PCP or her dermatologist. Sent message to PCP office. For the other small lesions suspect possible insect bites vs other and opted for hct cream if itchy and  bacitracin if any pustules develop.  Scheduled follow up with PCP offered:Sent message to schedulers to assist and advised patient to contact PCP office to schedule if does not receive call back in next 24 hours. Advised to seek prompt in person care if worsening, new symptoms arise, or if is not improving with treatment. Discussed options for inperson care if PCP office not available. Did let this patient know that I only do telemedicine on Tuesdays and Thursdays for [place]. Advised to schedule follow up visit with PCP or [facility] if any further questions or concerns to avoid delays in care.   I discussed the assessment and treatment plan with the patient. The patient was provided an opportunity to ask questions and all were answered. The patient agreed with the plan and demonstrated an understanding of the instructions.     Terressa Koyanagi, DO

## 2020-03-23 NOTE — Patient Instructions (Signed)
Apply topical antifungal to the patch on the right side twice daily.  Follow-up in person in 1 to 2 weeks with your primary care doctor or with your dermatologist.  -I sent the medication(s) we discussed to your pharmacy: Meds ordered this encounter  Medications  . mupirocin ointment (BACTROBAN) 2 %    Sig: Apply 1 application topically 2 (two) times daily.    Dispense:  22 g    Refill:  0  To use on any of the bumps that look like they may have passed.  Use for 5 days.   I hope you are feeling better soon!  Seek in person care promptly if your symptoms worsen, new concerns arise or you are not improving with treatment.  It was nice to meet you today. I help [facility] out with telemedicine visits on Tuesdays and Thursdays and am available for visits on those days. If you have any concerns or questions following this visit please schedule a follow up visit with your Primary Care doctor or seek care at a local urgent care clinic to avoid delays in care.

## 2020-03-24 NOTE — Progress Notes (Signed)
LVM asking to call back.  

## 2020-04-11 DIAGNOSIS — L308 Other specified dermatitis: Secondary | ICD-10-CM | POA: Diagnosis not present

## 2020-04-11 DIAGNOSIS — L300 Nummular dermatitis: Secondary | ICD-10-CM | POA: Diagnosis not present

## 2020-04-13 ENCOUNTER — Other Ambulatory Visit: Payer: Self-pay

## 2020-04-13 ENCOUNTER — Ambulatory Visit (INDEPENDENT_AMBULATORY_CARE_PROVIDER_SITE_OTHER): Payer: BC Managed Care – PPO | Admitting: Obstetrics & Gynecology

## 2020-04-13 ENCOUNTER — Encounter: Payer: Self-pay | Admitting: Obstetrics & Gynecology

## 2020-04-13 VITALS — BP 118/80 | HR 82 | Resp 20 | Ht 66.34 in | Wt 201.0 lb

## 2020-04-13 DIAGNOSIS — E661 Drug-induced obesity: Secondary | ICD-10-CM | POA: Diagnosis not present

## 2020-04-13 DIAGNOSIS — Z30431 Encounter for routine checking of intrauterine contraceptive device: Secondary | ICD-10-CM

## 2020-04-13 DIAGNOSIS — Z01419 Encounter for gynecological examination (general) (routine) without abnormal findings: Secondary | ICD-10-CM | POA: Diagnosis not present

## 2020-04-13 DIAGNOSIS — Z6832 Body mass index (BMI) 32.0-32.9, adult: Secondary | ICD-10-CM

## 2020-04-13 LAB — HM PAP SMEAR

## 2020-04-13 MED ORDER — PHENTERMINE HCL 37.5 MG PO TABS: 37.5000 mg | ORAL_TABLET | Freq: Every day | ORAL | 2 refills | Status: DC

## 2020-04-13 NOTE — Addendum Note (Signed)
Addended by: Burnice Logan on: 04/13/2020 04:05 PM   Modules accepted: Orders

## 2020-04-13 NOTE — Progress Notes (Signed)
[place] [birth year] [id number]   History:    45 y.o. G3P2A1L2 Married  [id number] presenting for annual gyn exam   SNK:NLZJ on Mirena IUD x 07/2018. No abnormal bleeding. No pelvic pain. No pain with intercourse. Urine and bowel movements normal. Breasts normal. Body mass index 33.15.Needs to exercise more frequently. Health labs with family physician.  Past medical history,surgical history, family history and social history were all reviewed and documented in the EPIC chart.  Gynecologic History No LMP recorded. (Menstrual status: IUD).  Obstetric History OB History  Gravida Para Term Preterm AB Living  3 2 2  0 1 2  SAB IAB Ectopic Multiple Live Births  1 0 0 0      # Outcome Date GA Lbr Len/2nd Weight Sex Delivery Anes PTL Lv  3 SAB           2 Term           1 Term              ROS: A ROS was performed and pertinent positives and negatives are included in the history.  GENERAL: No fevers or chills. HEENT: No change in vision, no earache, sore throat or sinus congestion. NECK: No pain or stiffness. CARDIOVASCULAR: No chest pain or pressure. No palpitations. PULMONARY: No shortness of breath, cough or wheeze. GASTROINTESTINAL: No abdominal pain, nausea, vomiting or diarrhea, melena or bright red blood per rectum. GENITOURINARY: No urinary frequency, urgency, hesitancy or dysuria. MUSCULOSKELETAL: No joint or muscle pain, no back pain, no recent trauma. DERMATOLOGIC: No rash, no itching, no lesions. ENDOCRINE: No polyuria, polydipsia, no heat or cold intolerance. No recent change in weight. HEMATOLOGICAL: No anemia or easy bruising or bleeding. NEUROLOGIC: No headache, seizures, numbness, tingling or weakness. PSYCHIATRIC: No depression, no loss of interest in normal activity or change in sleep pattern.     Exam:   BP 118/80 (BP Location: Left Arm, Patient Position: Sitting)   Pulse 82   Resp 20   Ht 5' 6.34" (1.685 m)   Wt 201 lb (91.2 kg)    BMI 32.11 kg/m   Body mass index is 32.11 kg/m.  General appearance : Well developed well nourished female. No acute distress HEENT: Eyes: no retinal hemorrhage or exudates,  Neck supple, trachea midline, no carotid bruits, no thyroidmegaly Lungs: Clear to auscultation, no rhonchi or wheezes, or rib retractions  Heart: Regular rate and rhythm, no murmurs or gallops Breast:Examined in sitting and supine position were symmetrical in appearance, no palpable masses or tenderness,  no skin retraction, no nipple inversion, no nipple discharge, no skin discoloration, no axillary or supraclavicular lymphadenopathy Abdomen: no palpable masses or tenderness, no rebound or guarding Extremities: no edema or skin discoloration or tenderness  Pelvic: Vulva: Normal             Vagina: No gross lesions or discharge  Cervix: No gross lesions or discharge.  IUD strings visible at the EO.  Pap reflex done.  Uterus  AV, normal size, shape and consistency, non-tender and mobile  Adnexa  Without masses or tenderness  Anus: Normal   Assessment/Plan:  45 y.o. female for annual exam   1. Encounter for routine gynecological examination with Papanicolaou smear of cervix Normal gynecologic exam.  Pap reflex done.  Breast exam normal.  Patient is overdue for a screening mammogram, will schedule now.  Health labs with family PA.  2. Encounter for routine checking of intrauterine contraceptive device (IUD) Mirena IUD since May  2020, in good position and well-tolerated without complication.  3. 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  Intermittent fasting reviewed.  Will add phentermine to suppress appetite for the coming 3 months as needed.  Aerobic activities 5 times a week and light weightlifting every 2 days recommended.  Other orders - phentermine (ADIPEX-P) 37.5 MG tablet; Take 1  tablet (37.5 mg total) by mouth daily before breakfast.  Princess Bruins MD, 3:14 PM 04/13/2020

## 2020-04-14 LAB — PAP IG W/ RFLX HPV ASCU

## 2020-05-12 DIAGNOSIS — K5900 Constipation, unspecified: Secondary | ICD-10-CM | POA: Diagnosis not present

## 2020-05-12 DIAGNOSIS — J4540 Moderate persistent asthma, uncomplicated: Secondary | ICD-10-CM | POA: Diagnosis not present

## 2020-05-12 DIAGNOSIS — T7840XA Allergy, unspecified, initial encounter: Secondary | ICD-10-CM | POA: Diagnosis not present

## 2020-05-12 DIAGNOSIS — K625 Hemorrhage of anus and rectum: Secondary | ICD-10-CM | POA: Diagnosis not present

## 2020-05-16 ENCOUNTER — Encounter: Payer: BC Managed Care – PPO | Admitting: Physician Assistant

## 2020-07-03 NOTE — Progress Notes (Signed)
I acted as a Education administrator for [employer], PA-C Anselmo Pickler, LPN    Subjective:    Tracie Riley is a 45 y.o. female and is here for a comprehensive physical exam.   HPI  Health Maintenance Due  Topic Date Due  . Hepatitis C Screening  Never done  . MAMMOGRAM  05/21/2019  . PAP SMEAR-Modifier  02/11/2020    Acute Concerns: None  Chronic Issues: GERD and dietary intolerances -- causes bloating, irregular stools, sees GI regularly. Taking bentyl regularly and protonix regularly. Has never had endoscopy, but has had colonoscopy. This was normal, due in 10 years (2031) Tobacco abuse -- currently smoking 1/2 PPD. Tried cold [place] in the past and was unsuccessful.   Health Maintenance: Immunizations -- UTD Colonoscopy -- 2021 -- due in 10 years Mammogram -- overdue PAP -- done with Dr. Dellis Filbert this year Bone Density -- N/A Diet -- overall healthy diet; takes a lactaid pills prn Exercise -- doesn't do much currently; would like to start walking regularly; does have an active job Weight -- Weight: 183 lb 9.6 oz (83.3 kg)  Mood -- overall well controlled currently Weight history: Wt Readings from Last 10 Encounters:  07/04/20 183 lb 9.6 oz (83.3 kg)  04/13/20 201 lb (91.2 kg)  07/12/19 217 lb 6 oz (98.6 kg)  05/13/19 221 lb 12.8 oz (100.6 kg)  04/29/19 221 lb 12.8 oz (100.6 kg)  02/17/19 218 lb (98.9 kg)  02/17/19 218 lb 6.4 oz (99.1 kg)  09/15/18 215 lb 4.8 oz (97.7 kg)  07/17/18 212 lb 3 oz (96.2 kg)  02/11/18 207 lb (93.9 kg)   Body mass index is 27.11 kg/m. No LMP recorded. (Menstrual status: IUD). Alcohol use: 6 glasses of wine per weekend Tobacco use: 1/2 PPD  Depression screen PHQ 2/9 07/04/2020  Decreased Interest 0  Down, Depressed, Hopeless 0  PHQ - 2 Score 0   UTD dentist  Other providers/specialists: Patient Care Team: Inda Coke, [place] as PCP - General (Physician Assistant)    PMHx, SurgHx, SocialHx, Medications, and Allergies were  reviewed in the Visit Navigator and updated as appropriate.   Past Medical History:  Diagnosis Date  . Asthma    only during pregnancy  . Back pain  - chornic neck and back pain, sees ortho ([facility]), had neg rheum workup per her report 05/19/2014  . Frequent headaches 05/19/2014  . GERD (gastroesophageal reflux disease)     History reviewed. No pertinent surgical history.   Family History  Problem Relation Age of Onset  . Rheum arthritis Mother   . Hepatitis C Mother   . Alcohol abuse Mother   . Cancer Mother        lung, smoker  . Alcohol abuse Father        deceased  . Colon cancer Neg Hx   . Rectal cancer Neg Hx   . Stomach cancer Neg Hx     Social History   Tobacco Use  . Smoking status: Current Every Day Smoker    Packs/day: 0.50    Years: 22.00    Pack years: 11.00    Types: Cigarettes  . Smokeless tobacco: Never Used  Vaping Use  . Vaping Use: Never used  Substance Use Topics  . Alcohol use: Yes    Alcohol/week: 4.0 - 6.0 standard drinks    Types: 4 - 6 Glasses of wine per week    Comment: WINE ON THE WEEKEND  . Drug use: No  Review of Systems:   Review of Systems  Constitutional: Negative for chills, fever, malaise/fatigue and weight loss.  HENT: Negative for hearing loss, sinus pain and sore throat.   Respiratory: Negative for cough and hemoptysis.   Cardiovascular: Negative for chest pain, palpitations, leg swelling and PND.  Gastrointestinal: Negative for abdominal pain, constipation, diarrhea, heartburn, nausea and vomiting.  Genitourinary: Negative for dysuria, frequency and urgency.  Musculoskeletal: Negative for back pain, myalgias and neck pain.  Skin: Negative for itching and rash.  Neurological: Negative for dizziness, tingling, seizures and headaches.  Endo/Heme/Allergies: Negative for polydipsia.  Psychiatric/Behavioral: Negative for depression. The patient is not nervous/anxious.     Objective:   BP 130/80   Pulse 84   Temp 97.6  F (36.4 C) (Temporal)   Ht 5\' 9"  (1.753 m)   Wt 183 lb 9.6 oz (83.3 kg)   SpO2 98%   BMI 27.11 kg/m  Body mass index is 27.11 kg/m.   General Appearance:    Alert, cooperative, no distress, appears stated age  Head:    Normocephalic, without obvious abnormality, atraumatic  Eyes:    PERRL, conjunctiva/corneas clear, EOM's intact, fundi    benign, both eyes  Ears:    Normal TM's and external ear canals, both ears  Nose:   Nares normal, septum midline, mucosa normal, no drainage    or sinus tenderness  Throat:   Lips, mucosa, and tongue normal; teeth and gums normal  Neck:   Supple, symmetrical, trachea midline, no adenopathy;    thyroid:  no enlargement/tenderness/nodules; no carotid   bruit or JVD  Back:     Symmetric, no curvature, ROM normal, no CVA tenderness  Lungs:     Clear to auscultation bilaterally, respirations unlabored  Chest Wall:    No tenderness or deformity   Heart:    Regular rate and rhythm, S1 and S2 normal, no murmur, rub or gallop  Breast Exam:    Deferred  Abdomen:     Soft, non-tender, bowel sounds active all four quadrants,    no masses, no organomegaly  Genitalia:    Deferred  Extremities:   Extremities normal, atraumatic, no cyanosis or edema  Pulses:   2+ and symmetric all extremities  Skin:   Skin color, texture, turgor normal, no rashes or lesions  Lymph nodes:   Cervical, supraclavicular, and axillary nodes normal  Neurologic:   CNII-XII intact, normal strength, sensation and reflexes    throughout     Assessment/Plan:   Tracie Riley was seen today for establish care.  Diagnoses and all orders for this visit:  Routine physical examination Today patient counseled on age appropriate routine health concerns for screening and prevention, each reviewed and up to date or declined. Immunizations reviewed and up to date or declined. Labs ordered and reviewed. Risk factors for depression reviewed and negative. Hearing function and visual acuity are intact.  ADLs screened and addressed as needed. Functional ability and level of safety reviewed and appropriate. Education, counseling and referrals performed based on assessed risks today. Patient provided with a copy of personalized plan for preventive services.  Tobacco use disorder -     Lipid panel  Overweight Encouraged regular diet and exercise as able. -     CBC with Differential/Platelet -     Comprehensive metabolic panel  Encounter for lipid screening for cardiovascular disease -     Lipid panel  Encounter for screening for other viral diseases -     Hepatitis C antibody  Gastroesophageal  reflux disease, unspecified whether esophagitis present; Generalized abdominal pain Mgmt per GI. States that she is currently stable on protonix and bentyl.   Well Adult Exam: Labs ordered: Yes. Patient counseling was done. See below for items discussed. Discussed the patient's BMI.  The BMI is not in the acceptable range; BMI management plan is completed Follow up in one year. Breast cancer screening: encouraged, overdue. Cervical cancer screening: UTD   Patient Counseling: [x]    Nutrition: Stressed importance of moderation in sodium/caffeine intake, saturated fat and cholesterol, caloric balance, sufficient intake of fresh fruits, vegetables, fiber, calcium, iron, and 1 mg of folate supplement per day (for females capable of pregnancy).  [x]    Stressed the importance of regular exercise.   [x]    Substance Abuse: Discussed cessation/primary prevention of tobacco, alcohol, or other drug use; driving or other dangerous activities under the influence; availability of treatment for abuse.   [x]    Injury prevention: Discussed safety belts, safety helmets, smoke detector, smoking near bedding or upholstery.   [x]    Sexuality: Discussed sexually transmitted diseases, partner selection, use of condoms, avoidance of unintended pregnancy  and contraceptive alternatives.  [x]    Dental health: Discussed  importance of regular tooth brushing, flossing, and dental visits.  [x]    Health maintenance and immunizations reviewed. Please refer to Health maintenance section.   CMA or LPN served as scribe during this visit. History, Physical, and Plan performed by medical provider. The above documentation has been reviewed and is accurate and complete.  Inda Coke, PA-C [place]

## 2020-07-04 ENCOUNTER — Encounter: Payer: Self-pay | Admitting: Physician Assistant

## 2020-07-04 ENCOUNTER — Ambulatory Visit (INDEPENDENT_AMBULATORY_CARE_PROVIDER_SITE_OTHER): Payer: BC Managed Care – PPO | Admitting: Physician Assistant

## 2020-07-04 ENCOUNTER — Other Ambulatory Visit: Payer: Self-pay

## 2020-07-04 VITALS — BP 130/80 | HR 84 | Temp 97.6°F | Ht 69.0 in | Wt 183.6 lb

## 2020-07-04 DIAGNOSIS — Z1159 Encounter for screening for other viral diseases: Secondary | ICD-10-CM

## 2020-07-04 DIAGNOSIS — F17200 Nicotine dependence, unspecified, uncomplicated: Secondary | ICD-10-CM

## 2020-07-04 DIAGNOSIS — R1084 Generalized abdominal pain: Secondary | ICD-10-CM

## 2020-07-04 DIAGNOSIS — Z136 Encounter for screening for cardiovascular disorders: Secondary | ICD-10-CM | POA: Diagnosis not present

## 2020-07-04 DIAGNOSIS — Z0000 Encounter for general adult medical examination without abnormal findings: Secondary | ICD-10-CM | POA: Diagnosis not present

## 2020-07-04 DIAGNOSIS — E663 Overweight: Secondary | ICD-10-CM | POA: Diagnosis not present

## 2020-07-04 DIAGNOSIS — K219 Gastro-esophageal reflux disease without esophagitis: Secondary | ICD-10-CM

## 2020-07-04 DIAGNOSIS — Z13220 Encounter for screening for lipoid disorders: Secondary | ICD-10-CM

## 2020-07-04 LAB — CBC WITH DIFFERENTIAL/PLATELET
Basophils Absolute: 0.1 10*3/uL (ref 0.0–0.1)
Basophils Relative: 1 % (ref 0.0–3.0)
Eosinophils Absolute: 0.3 10*3/uL (ref 0.0–0.7)
Eosinophils Relative: 6.4 % — ABNORMAL HIGH (ref 0.0–5.0)
HCT: 46 % (ref 36.0–46.0)
Hemoglobin: 15.6 g/dL — ABNORMAL HIGH (ref 12.0–15.0)
Lymphocytes Relative: 29.8 % (ref 12.0–46.0)
Lymphs Abs: 1.6 10*3/uL (ref 0.7–4.0)
MCHC: 33.9 g/dL (ref 30.0–36.0)
MCV: 95.6 fl (ref 78.0–100.0)
Monocytes Absolute: 0.4 10*3/uL (ref 0.1–1.0)
Monocytes Relative: 8.2 % (ref 3.0–12.0)
Neutro Abs: 2.9 10*3/uL (ref 1.4–7.7)
Neutrophils Relative %: 54.6 % (ref 43.0–77.0)
Platelets: 270 10*3/uL (ref 150.0–400.0)
RBC: 4.81 Mil/uL (ref 3.87–5.11)
RDW: 13.5 % (ref 11.5–15.5)
WBC: 5.4 10*3/uL (ref 4.0–10.5)

## 2020-07-04 LAB — COMPREHENSIVE METABOLIC PANEL
ALT: 24 U/L (ref 0–35)
AST: 16 U/L (ref 0–37)
Albumin: 4.5 g/dL (ref 3.5–5.2)
Alkaline Phosphatase: 56 U/L (ref 39–117)
BUN: 12 mg/dL (ref 6–23)
CO2: 30 mEq/L (ref 19–32)
Calcium: 9.7 mg/dL (ref 8.4–10.5)
Chloride: 102 mEq/L (ref 96–112)
Creatinine, Ser: 0.66 mg/dL (ref 0.40–1.20)
GFR: 107.05 mL/min (ref >60.00)
Glucose, Bld: 93 mg/dL (ref 70–99)
Potassium: 4.5 mEq/L (ref 3.5–5.1)
Sodium: 138 mEq/L (ref 135–145)
Total Bilirubin: 0.7 mg/dL (ref 0.2–1.2)
Total Protein: 6.8 g/dL (ref 6.0–8.3)

## 2020-07-04 LAB — LIPID PANEL
Cholesterol: 196 mg/dL (ref 0–200)
HDL: 57.8 mg/dL (ref >39.00)
LDL Cholesterol: 109 mg/dL — ABNORMAL HIGH (ref 0–99)
NonHDL: 138.33
Total CHOL/HDL Ratio: 3
Triglycerides: 145 mg/dL (ref 0.0–149.0)
VLDL: 29 mg/dL (ref 0.0–40.0)

## 2020-07-04 NOTE — Patient Instructions (Addendum)
It was great to see you!  Please follow-up with [facility] to get your mammogram.  When you are ready to discuss smoking reduction, let me know.  Please go to the lab for blood work.   Our office will call you with your results unless you have chosen to receive results via MyChart.  If your blood work is normal we will follow-up each year for physicals and as scheduled for chronic medical problems.  If anything is abnormal we will treat accordingly and get you in for a follow-up.  Take care,  [facility] Maintenance, Female Adopting a healthy lifestyle and getting preventive care are important in promoting health and wellness. Ask your health care provider about:  The right schedule for you to have regular tests and exams.  Things you can do on your own to prevent diseases and keep yourself healthy. What should I know about diet, weight, and exercise? Eat a healthy diet  Eat a diet that includes plenty of vegetables, fruits, low-fat dairy products, and lean protein.  Do not eat a lot of foods that are high in solid fats, added sugars, or sodium.   Maintain a healthy weight Body mass index (BMI) is used to identify weight problems. It estimates body fat based on height and weight. Your health care provider can help determine your BMI and help you achieve or maintain a healthy weight. Get regular exercise Get regular exercise. This is one of the most important things you can do for your health. Most adults should:  Exercise for at least 150 minutes each week. The exercise should increase your heart rate and make you sweat (moderate-intensity exercise).  Do strengthening exercises at least twice a week. This is in addition to the moderate-intensity exercise.  Spend less time sitting. Even light physical activity can be beneficial. Watch cholesterol and blood lipids Have your blood tested for lipids and cholesterol at 44 years of age, then have this  test every 5 years. Have your cholesterol levels checked more often if:  Your lipid or cholesterol levels are high.  You are older than 44 years of age.  You are at high risk for heart disease. What should I know about cancer screening? Depending on your health history and family history, you may need to have cancer screening at various ages. This may include screening for:  Breast cancer.  Cervical cancer.  Colorectal cancer.  Skin cancer.  Lung cancer. What should I know about heart disease, diabetes, and high blood pressure? Blood pressure and heart disease  High blood pressure causes heart disease and increases the risk of stroke. This is more likely to develop in people who have high blood pressure readings, are of African descent, or are overweight.  Have your blood pressure checked: ? Every 3-5 years if you are 91-75 years of age. ? Every year if you are 4 years old or older. Diabetes Have regular diabetes screenings. This checks your fasting blood sugar level. Have the screening done:  Once every three years after age 55 if you are at a normal weight and have a low risk for diabetes.  More often and at a younger age if you are overweight or have a high risk for diabetes. What should I know about preventing infection? Hepatitis B If you have a higher risk for hepatitis B, you should be screened for this virus. Talk with your health care provider to find out if you are at risk for hepatitis  B infection. Hepatitis C Testing is recommended for:  Everyone born from [birth year] through 1964.  Anyone with known risk factors for hepatitis C. Sexually transmitted infections (STIs)  Get screened for STIs, including gonorrhea and chlamydia, if: ? You are sexually active and are younger than 44 years of age. ? You are older than 44 years of age and your health care provider tells you that you are at risk for this type of infection. ? Your sexual activity has changed since you  were last screened, and you are at increased risk for chlamydia or gonorrhea. Ask your health care provider if you are at risk.  Ask your health care provider about whether you are at high risk for HIV. Your health care provider may recommend a prescription medicine to help prevent HIV infection. If you choose to take medicine to prevent HIV, you should first get tested for HIV. You should then be tested every 3 months for as long as you are taking the medicine. Pregnancy  If you are about to stop having your period (premenopausal) and you may become pregnant, seek counseling before you get pregnant.  Take 400 to 800 micrograms (mcg) of folic acid every day if you become pregnant.  Ask for birth control (contraception) if you want to prevent pregnancy. Osteoporosis and menopause Osteoporosis is a disease in which the bones lose minerals and strength with aging. This can result in bone fractures. If you are 39 years old or older, or if you are at risk for osteoporosis and fractures, ask your health care provider if you should:  Be screened for bone loss.  Take a calcium or vitamin D supplement to lower your risk of fractures.  Be given hormone replacement therapy (HRT) to treat symptoms of menopause. Follow these instructions at home: Lifestyle  Do not use any products that contain nicotine or tobacco, such as cigarettes, e-cigarettes, and chewing tobacco. If you need help quitting, ask your health care provider.  Do not use street drugs.  Do not share needles.  Ask your health care provider for help if you need support or information about quitting drugs. Alcohol use  Do not drink alcohol if: ? Your health care provider tells you not to drink. ? You are pregnant, may be pregnant, or are planning to become pregnant.  If you drink alcohol: ? Limit how much you use to 0-1 drink a day. ? Limit intake if you are breastfeeding.  Be aware of how much alcohol is in your drink. In the  [place] one drink equals one 12 oz bottle of beer (355 mL), one 5 oz glass of wine (148 mL), or one 1 oz glass of hard liquor (44 mL). General instructions  Schedule regular health, dental, and eye exams.  Stay current with your vaccines.  Tell your health care provider if: ? You often feel depressed. ? You have ever been abused or do not feel safe at home. Summary  Adopting a healthy lifestyle and getting preventive care are important in promoting health and wellness.  Follow your health care provider's instructions about healthy diet, exercising, and getting tested or screened for diseases.  Follow your health care provider's instructions on monitoring your cholesterol and blood pressure. This information is not intended to replace advice given to you by your health care provider. Make sure you discuss any questions you have with your health care provider. Document Revised: 02/25/2018 Document Reviewed: 02/25/2018 Elsevier Patient Education  2021 [employer].

## 2020-07-05 LAB — HEPATITIS C ANTIBODY
Hepatitis C Ab: NONREACTIVE
SIGNAL TO CUT-OFF: 0 (ref <1.00)

## 2020-07-15 ENCOUNTER — Other Ambulatory Visit: Payer: Self-pay | Admitting: Obstetrics & Gynecology

## 2020-07-17 NOTE — Telephone Encounter (Signed)
Medication refill request: Phentermine  Last AEX:  04-13-20 ML Next AEX: 04-18-21 Last MMG (if hormonal medication request): n/a Refill authorized: Today, please advise.   Medication pended for #30, 2RF. Please refill if appropriate.

## 2020-07-18 ENCOUNTER — Other Ambulatory Visit: Payer: Self-pay | Admitting: Gastroenterology

## 2020-07-25 ENCOUNTER — Telehealth: Payer: Self-pay | Admitting: *Deleted

## 2020-07-25 NOTE — Telephone Encounter (Signed)
PA done via cover my meds for phentermine 37.5 mg tablet. Will wait for response from insurance company.

## 2020-08-27 ENCOUNTER — Other Ambulatory Visit: Payer: Self-pay | Admitting: Gastroenterology

## 2020-10-12 ENCOUNTER — Encounter: Payer: Self-pay | Admitting: Gastroenterology

## 2020-10-12 ENCOUNTER — Telehealth: Payer: Self-pay | Admitting: Gastroenterology

## 2020-10-12 NOTE — Telephone Encounter (Signed)
Called patient and scheduled for 11/09/2020.

## 2020-10-12 NOTE — Telephone Encounter (Signed)
As per my last refill note, patient needs to schedule appt before she can have any more refills.

## 2020-10-12 NOTE — Telephone Encounter (Signed)
Inbound call from patient requesting refills for dicyclomine medication to be sent to [facility] in chart please.

## 2020-11-09 ENCOUNTER — Ambulatory Visit: Payer: BC Managed Care – PPO | Admitting: Nurse Practitioner

## 2020-12-22 ENCOUNTER — Other Ambulatory Visit: Payer: Self-pay | Admitting: Gastroenterology

## 2020-12-25 ENCOUNTER — Other Ambulatory Visit: Payer: Self-pay | Admitting: Obstetrics & Gynecology

## 2020-12-25 DIAGNOSIS — N6320 Unspecified lump in the left breast, unspecified quadrant: Secondary | ICD-10-CM

## 2020-12-25 DIAGNOSIS — N6489 Other specified disorders of breast: Secondary | ICD-10-CM

## 2020-12-30 ENCOUNTER — Ambulatory Visit
Admission: RE | Admit: 2020-12-30 | Discharge: 2020-12-30 | Disposition: A | Discharge status: Home / Self Care | Payer: BC Managed Care – PPO | Source: Ambulatory Visit | Attending: Obstetrics & Gynecology | Admitting: Obstetrics & Gynecology

## 2020-12-30 DIAGNOSIS — N6489 Other specified disorders of breast: Secondary | ICD-10-CM

## 2020-12-30 DIAGNOSIS — N6320 Unspecified lump in the left breast, unspecified quadrant: Secondary | ICD-10-CM

## 2020-12-30 DIAGNOSIS — R922 Inconclusive mammogram: Secondary | ICD-10-CM | POA: Diagnosis not present

## 2021-01-04 ENCOUNTER — Ambulatory Visit (INDEPENDENT_AMBULATORY_CARE_PROVIDER_SITE_OTHER): Payer: BC Managed Care – PPO | Admitting: Orthopedic Surgery

## 2021-01-04 ENCOUNTER — Ambulatory Visit: Payer: BC Managed Care – PPO | Admitting: Orthopedic Surgery

## 2021-01-04 ENCOUNTER — Ambulatory Visit: Payer: Self-pay

## 2021-01-04 ENCOUNTER — Other Ambulatory Visit: Payer: Self-pay

## 2021-01-04 DIAGNOSIS — M79641 Pain in right hand: Secondary | ICD-10-CM

## 2021-01-04 MED ORDER — DICLOFENAC SODIUM 75 MG PO TBEC: 75.0000 mg | DELAYED_RELEASE_TABLET | Freq: Two times a day (BID) | ORAL | 0 refills | Status: DC

## 2021-01-04 NOTE — Progress Notes (Signed)
Office Visit Note   Patient: Tracie Riley           Date of Birth: 1975-12-03           MRN: [id number] Visit Date: 01/04/2021              Requested by: Inda Coke, [place] [address] [place],  [place] [id number] PCP: Inda Coke, [place]   Assessment & Plan: Visit Diagnoses:  1. Pain in right hand     Plan: Discussed with patient that she has no significant provocative exam findings and nothing obvious on x-ray.  The timing of her pain correlating with her overuse of the pressure washer is suggestive of an overuse or tendinitis type picture.  Most of her pain seem to be on the dorsal radial aspect of the wrist which could suggest tendinitis involving the radial wrist extensors.  I will prescribe her Diclofenac for several weeks in addition to using a wrist brace with activity modification.  She cannot can see me in a month or 2 if she still having difficulty.  Follow-Up Instructions: No follow-ups on file.   Orders:  Orders Placed This Encounter  Procedures   XR Wrist 2 Views Right   Meds ordered this encounter  Medications   diclofenac (VOLTAREN) 75 MG EC tablet    Sig: Take 1 tablet (75 mg total) by mouth 2 (two) times daily for 14 days.    Dispense:  28 tablet    Refill:  0      Procedures: No procedures performed   Clinical Data: No additional findings.   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  Her symptoms seemingly began approximately 2 months ago when she was using a pressure washer.  She has that she had to maintain a tight grip on the handle washer for several hours.  Since that time she notes that she has pain in the dorsal radial aspect of the wrist mostly.  The pain is dull and aching in nature but vague in location.  She thinks it is mostly dorsal radial but she also has  some pain going into her MP joints.  She denies any falls or trauma.  The pain has resulted in decreased strength and grip.   Review of Systems   Objective: Vital Signs: There were no vitals taken for this visit.  Physical Exam Constitutional:      Appearance: Normal appearance.  Cardiovascular:     Rate and Rhythm: Normal rate.     Pulses: Normal pulses.  Skin:    General: Skin is warm and dry.     Capillary Refill: Capillary refill takes less than 2 seconds.  Neurological:     Mental Status: She is alert.    Right Hand Exam   Tenderness  Right hand tenderness location: Poorly localized TTP but mostly on the radial aspect of the dorsal wrist.  Range of Motion  The patient has normal right wrist ROM.   Muscle Strength  The patient has normal right wrist strength.  Other  Erythema: absent Sensation: normal Pulse: present  Comments:  Mild discomfort with resisted wrist extension.  No ulnar sided pain.  No pain w/ AROM of wrist.  No DRUJ instability.       Specialty Comments:  No specialty comments available.  Imaging: 2 views of the left wrist taken today reviewed interpreted by me.  They demonstrate no acute bony injury.  There is no evidence of carpal instability.  There is no significant degenerative changes involving the radiocarpal midcarpal or CMC joint.   PMFS History: Patient Active Problem List   Diagnosis Date Noted   Lactose intolerance 07/12/2019   Bereavment, Anxiety 08/19/2014   BMI 32.0-32.9,adult 08/19/2014   Back pain  - chornic neck and back pain, sees ortho ([facility]), had neg rheum workup per her report 05/19/2014   Frequent headaches 05/19/2014   Tobacco use disorder 05/19/2014   Peri-menopause 01/07/2013   Past Medical History:  Diagnosis Date   Asthma    only during pregnancy   Back pain  - chornic neck and back pain, sees ortho ([facility]), had neg rheum workup per her report 05/19/2014   Frequent headaches 05/19/2014   GERD  (gastroesophageal reflux disease)     Family History  Problem Relation Age of Onset   Rheum arthritis Mother    Hepatitis C Mother    Alcohol abuse Mother    Cancer Mother        lung, smoker   Alcohol abuse Father        deceased   Colon cancer Neg Hx    Rectal cancer Neg Hx    Stomach cancer Neg Hx     No past surgical history on file. Social History   Occupational History   Not on file  Tobacco Use   Smoking status: Every Day    Packs/day: 0.50    Years: 22.00    Pack years: 11.00    Types: Cigarettes   Smokeless tobacco: Never  Vaping Use   Vaping Use: Never used  Substance and Sexual Activity   Alcohol use: Yes    Alcohol/week: 4.0 - 6.0 standard drinks    Types: 4 - 6 Glasses of wine per week    Comment: WINE ON THE WEEKEND   Drug use: No   Sexual activity: Yes    Partners: Male    Birth control/protection: I.U.D.    Comment: 1ST intercourse- 49, partners- 4, current partner- 51

## 2021-01-25 ENCOUNTER — Other Ambulatory Visit: Payer: Self-pay | Admitting: Orthopedic Surgery

## 2021-01-29 ENCOUNTER — Ambulatory Visit: Payer: BC Managed Care – PPO | Admitting: Orthopedic Surgery

## 2021-01-30 ENCOUNTER — Other Ambulatory Visit: Payer: Self-pay | Admitting: Radiology

## 2021-01-30 MED ORDER — DICLOFENAC SODIUM 75 MG PO TBEC: 75.0000 mg | DELAYED_RELEASE_TABLET | Freq: Two times a day (BID) | ORAL | 0 refills | Status: DC

## 2021-01-31 ENCOUNTER — Encounter: Payer: Self-pay | Admitting: Nurse Practitioner

## 2021-01-31 ENCOUNTER — Ambulatory Visit (INDEPENDENT_AMBULATORY_CARE_PROVIDER_SITE_OTHER): Payer: BC Managed Care – PPO | Admitting: Nurse Practitioner

## 2021-01-31 VITALS — BP 118/72 | HR 74 | Ht 69.0 in | Wt 179.2 lb

## 2021-01-31 DIAGNOSIS — K219 Gastro-esophageal reflux disease without esophagitis: Secondary | ICD-10-CM

## 2021-01-31 DIAGNOSIS — K589 Irritable bowel syndrome without diarrhea: Secondary | ICD-10-CM | POA: Diagnosis not present

## 2021-01-31 MED ORDER — DICYCLOMINE HCL 20 MG PO TABS: 20.0000 mg | ORAL_TABLET | Freq: Two times a day (BID) | ORAL | 4 refills | Status: DC | PRN

## 2021-01-31 MED ORDER — PANTOPRAZOLE SODIUM 40 MG PO TBEC: 40.0000 mg | DELAYED_RELEASE_TABLET | Freq: Every day | ORAL | 3 refills | Status: DC

## 2021-01-31 NOTE — Progress Notes (Signed)
Reviewed and agree with management plan.  Tanis Hensarling T. Declynn Lopresti, MD FACG 

## 2021-01-31 NOTE — Patient Instructions (Addendum)
If you are age 44 or younger, your body mass index should be between 19-25. Your Body mass index is 26.46 kg/m. If this is out of the aformentioned range listed, please consider follow up with your Primary Care Provider.  ________________________________________________________  The [facility] providers would like to encourage you to use [facility] to communicate with providers for non-urgent requests or questions.  Due to long hold times on the telephone, sending your provider a message by [facility] may be a faster and more efficient way to get a response.  Please allow 48 business hours for a response.  Please remember that this is for non-urgent requests.  _______________________________________________________  We have sent in refills of your Pantoprazole and Dicyclomine.  Thank you for entrusting me with your care and choosing [facility]  Tye Savoy, NP-C

## 2021-01-31 NOTE — Progress Notes (Signed)
ASSESSMENT AND PLAN    # GERD. She feels like diet changes have helped but out of pantoprazole for ~ month and having recurrent heartburn and regurgitation --Refill pantoprazole  # IBS / lactose intolerance. Bloating and diarrhea significantly improved with dietary changes, especially avoidance of dairy and reduction in gluten intake. Uses Bentyl less often now.  --Refill Bentyl 10 mg.   HISTORY OF PRESENT ILLNESS    Chief Complaint : refills on medication  Tracie Riley is a 45 y.o. female known to Dr. Fuller Plan. She has a past medical history of GERD and IBS.  Additional medical history as listed in [place] .   Patient was last seen by Dr. Fuller Plan April 2021 for GERD, suspected IBS with postprandial bloating / diarrhea.  Normal colonoscopy with random biopsies in Feb 2021. Polyps were hyplastic. A 10 year screening colonoscopy was recommended.  Celiac studies in 2019 were negative.  She is requesting refills on pantoprazole and Dicyclomine  [place] has lactose intolerance. Consumption of diary leads to explosive diarrhea / bloating  / fatigue.  She has also been trying to avoid gluten which gives her similar symptoms.  Since making these dietary changes she been able to lose weight, the bloating has significantly improved,  she has required less Bentyl and overall just feels much better.  Her bowel movements are usually solid . She hasn't totally figured out all of her food triggers.. Celiac studies in 2019 including tTg , IgA were normal.   April 2022 CMP normal  Hgb 15.6  PREVIOUS ENDOSCOPIC EVALUATIONS / PERTINENT STUDIES:   Colonoscopy Feb 2021 for diarrhea -Three 6 to 7 mm polyps in the rectum and in the descending colon, removed. Random colon biopsies taken.   Surgical [P], random sites - COLONIC MUCOSA WITH NO SIGNIFICANT HISTOPATHOLOGIC CHANGES. - NO MICROSCOPIC COLITIS, ACTIVE INFLAMMATION, OR CHRONIC CHANGES. 2. Surgical [P], colon, rectum, polyp (3) - HYPERPLASTIC  POLYP(S). - NO ADENOMATOUS CHANGE OR CARCINOMA Next colonoscopy due Feb 2031  Current Medications, Allergies, Past Medical History, Past Surgical History, Family History and Social History were reviewed in [employer] record.     Current Outpatient Medications  Medication Sig Dispense Refill   diclofenac (VOLTAREN) 75 MG EC tablet Take 1 tablet (75 mg total) by mouth 2 (two) times daily. 28 tablet 0   levonorgestrel (MIRENA) 20 MCG/24HR IUD 1 each by Intrauterine route once.     mupirocin ointment (BACTROBAN) 2 % Apply 1 application topically 2 (two) times daily. 22 g 0   phentermine (ADIPEX-P) 37.5 MG tablet TAKE 1 TABLET(37.5 MG) BY MOUTH DAILY BEFORE BREAKFAST 30 tablet 2   valACYclovir (VALTREX) 1000 MG tablet TAKE 1 TABLET BY MOUTH DAILY FOR 5 DAYS 15 tablet 4   dicyclomine (BENTYL) 20 MG tablet Take 1 tablet (20 mg total) by mouth 4 (four) times daily -  before meals and at bedtime. (Patient not taking: Reported on 01/31/2021) 120 tablet 11   pantoprazole (PROTONIX) 40 MG tablet Take 1 tablet (40 mg total) by mouth daily. APPOINTMENT NEEDED FOR FURTHER REFILLS (Patient not taking: Reported on 01/31/2021) 30 tablet 1   No current facility-administered medications for this visit.    Review of Systems: No chest pain. No shortness of breath. No urinary complaints.   PHYSICAL EXAM :    Wt Readings from Last 3 Encounters:  01/31/21 179 lb 3.2 oz (81.3 kg)  07/04/20 183 lb 9.6 oz (83.3 kg)  04/13/20 201 lb (91.2 kg)  BP 118/72   Pulse 74   Wt 179 lb 3.2 oz (81.3 kg)   BMI 26.46 kg/m  Constitutional:  Generally well appearing female in no acute distress. Psychiatric: Pleasant. Normal mood and affect. Behavior is normal. EENT: Pupils normal.  Conjunctivae are normal. No scleral icterus. Neck supple.  Cardiovascular: Normal rate, regular rhythm. No edema Pulmonary/chest: Effort normal and breath sounds normal. No wheezing, rales or rhonchi. Abdominal:  Soft, nondistended, nontender. Bowel sounds active throughout. There are no masses palpable. No hepatomegaly. Neurological: Alert and oriented to person place and time. Skin: Skin is warm and dry. No rashes noted.  Tye Savoy, NP  01/31/2021, 9:43 AM  Cc:  Inda Coke, [place]

## 2021-03-18 ENCOUNTER — Other Ambulatory Visit: Payer: Self-pay | Admitting: Obstetrics & Gynecology

## 2021-03-20 NOTE — Telephone Encounter (Signed)
AEX scheduled for 04/18/21. Mammo UTD.

## 2021-04-18 ENCOUNTER — Ambulatory Visit: Payer: BC Managed Care – PPO | Admitting: Obstetrics & Gynecology

## 2021-06-01 ENCOUNTER — Encounter: Payer: Self-pay | Admitting: Obstetrics & Gynecology

## 2021-06-01 ENCOUNTER — Ambulatory Visit (INDEPENDENT_AMBULATORY_CARE_PROVIDER_SITE_OTHER): Payer: BC Managed Care – PPO | Admitting: Obstetrics & Gynecology

## 2021-06-01 ENCOUNTER — Other Ambulatory Visit: Payer: Self-pay

## 2021-06-01 VITALS — BP 120/78 | HR 74 | Resp 16 | Ht 66.75 in | Wt 188.0 lb

## 2021-06-01 DIAGNOSIS — E663 Overweight: Secondary | ICD-10-CM

## 2021-06-01 DIAGNOSIS — Z01419 Encounter for gynecological examination (general) (routine) without abnormal findings: Secondary | ICD-10-CM | POA: Diagnosis not present

## 2021-06-01 DIAGNOSIS — Z30431 Encounter for routine checking of intrauterine contraceptive device: Secondary | ICD-10-CM | POA: Diagnosis not present

## 2021-06-01 NOTE — Progress Notes (Signed)
? ? ?Tracie Riley 11-28-1975 [id number] ? ? ?History:    46 y.o.G3P2A1L2 Married.  Children 14 and 34 yo this year. ?  ?RP:  Established patient presenting for annual gyn exam  ?  ?HPI: Well on Mirena IUD x 07/2018.  No abnormal bleeding.  No pelvic pain.  No pain with intercourse. Pap Neg 03/2020.  Urine and bowel movements normal.  Breasts normal. Mammo 12/2020, Lt Breast US Benign. Body mass index decreased to 29.67.  Exercising regularly .  Health labs with family physician.  Colono 04/2019. ? ?Past medical history,surgical history, family history and social history were all reviewed and documented in the EPIC chart. ? ?Gynecologic History ?Patient's last menstrual period was 05/07/2021. ? ?Obstetric History ?OB History  ?Gravida Para Term Preterm AB Living  ?'3 2 2 '$ 0 1 2  ?SAB IAB Ectopic Multiple Live Births  ?1 0 0 0    ?  ?# Outcome Date GA Lbr Len/2nd Weight Sex Delivery Anes PTL Lv  ?3 SAB           ?2 Term           ?1 Term           ? ? ? ?ROS: A ROS was performed and pertinent positives and negatives are included in the history. ? GENERAL: No fevers or chills. HEENT: No change in vision, no earache, sore throat or sinus congestion. NECK: No pain or stiffness. CARDIOVASCULAR: No chest pain or pressure. No palpitations. PULMONARY: No shortness of breath, cough or wheeze. GASTROINTESTINAL: No abdominal pain, nausea, vomiting or diarrhea, melena or bright red blood per rectum. GENITOURINARY: No urinary frequency, urgency, hesitancy or dysuria. MUSCULOSKELETAL: No joint or muscle pain, no back pain, no recent trauma. DERMATOLOGIC: No rash, no itching, no lesions. ENDOCRINE: No polyuria, polydipsia, no heat or cold intolerance. No recent change in weight. HEMATOLOGICAL: No anemia or easy bruising or bleeding. NEUROLOGIC: No headache, seizures, numbness, tingling or weakness. PSYCHIATRIC: No depression, no loss of interest in normal activity or change in sleep pattern.  ?  ? ?Exam: ? ? ?BP 120/78   Pulse 74    Resp 16   Ht 5' 6.75" (1.695 m)   Wt 188 lb (85.3 kg)   LMP 05/07/2021 Comment: mirena iud inserted 07-30-18  BMI 29.67 kg/m?  ? ?Body mass index is 29.67 kg/m?. ? ?General appearance : Well developed well nourished female. No acute distress ?HEENT: Eyes: no retinal hemorrhage or exudates,  Neck supple, trachea midline, no carotid bruits, no thyroidmegaly ?Lungs: Clear to auscultation, no rhonchi or wheezes, or rib retractions  ?Heart: Regular rate and rhythm, no murmurs or gallops ?Breast:Examined in sitting and supine position were symmetrical in appearance, no palpable masses or tenderness,  no skin retraction, no nipple inversion, no nipple discharge, no skin discoloration, no axillary or supraclavicular lymphadenopathy ?Abdomen: no palpable masses or tenderness, no rebound or guarding ?Extremities: no edema or skin discoloration or tenderness ? ?Pelvic: Vulva: Normal ?            Vagina: No gross lesions or discharge ? Cervix: No gross lesions or discharge.  IUD strings felt at the [facility]. ? Uterus  AV, normal size, shape and consistency, non-tender and mobile ? Adnexa  Without masses or tenderness ? Anus: Normal ? ? ?Assessment/Plan:  46 y.o. female for annual exam  ? ?1. Well female exam with routine gynecological exam ?Well on Mirena IUD x 07/2018.  No abnormal bleeding.  No pelvic pain.  No  pain with intercourse. Pap Neg 03/2020.  Urine and bowel movements normal.  Breasts normal. Mammo 12/2020, Lt Breast US Benign. Body mass index decreased to 29.67.  Exercising regularly .  Health labs with family physician.  Colono 04/2019. ? ?2. Encounter for routine checking of intrauterine contraceptive device (IUD) ?Well on Mirena IUD x 07/2018.  No abnormal bleeding.  No pelvic pain.  No pain with intercourse.  IUD in good position. ? ?3. Overweight (BMI 25.0-29.9)  ?Significant weight loss x last year.  Continue with low calorie/carb diet and fitness. ? ?Princess Bruins MD, 1:39 PM 06/01/2021 ? ?  ?

## 2021-07-10 DIAGNOSIS — L570 Actinic keratosis: Secondary | ICD-10-CM | POA: Diagnosis not present

## 2021-07-11 ENCOUNTER — Encounter: Payer: Self-pay | Admitting: Physician Assistant

## 2021-07-11 ENCOUNTER — Ambulatory Visit (INDEPENDENT_AMBULATORY_CARE_PROVIDER_SITE_OTHER): Payer: BC Managed Care – PPO | Admitting: Physician Assistant

## 2021-07-11 VITALS — BP 102/70 | HR 79 | Temp 98.0°F | Ht 67.5 in | Wt 189.4 lb

## 2021-07-11 DIAGNOSIS — Z136 Encounter for screening for cardiovascular disorders: Secondary | ICD-10-CM | POA: Diagnosis not present

## 2021-07-11 DIAGNOSIS — F17200 Nicotine dependence, unspecified, uncomplicated: Secondary | ICD-10-CM

## 2021-07-11 DIAGNOSIS — K219 Gastro-esophageal reflux disease without esophagitis: Secondary | ICD-10-CM

## 2021-07-11 DIAGNOSIS — Z13220 Encounter for screening for lipoid disorders: Secondary | ICD-10-CM

## 2021-07-11 DIAGNOSIS — Z0000 Encounter for general adult medical examination without abnormal findings: Secondary | ICD-10-CM | POA: Diagnosis not present

## 2021-07-11 DIAGNOSIS — K589 Irritable bowel syndrome without diarrhea: Secondary | ICD-10-CM

## 2021-07-11 LAB — CBC WITH DIFFERENTIAL/PLATELET
Basophils Absolute: 0.1 10*3/uL (ref 0.0–0.1)
Basophils Relative: 0.9 % (ref 0.0–3.0)
Eosinophils Absolute: 0.2 10*3/uL (ref 0.0–0.7)
Eosinophils Relative: 3.9 % (ref 0.0–5.0)
HCT: 40.2 % (ref 36.0–46.0)
Hemoglobin: 13.6 g/dL (ref 12.0–15.0)
Lymphocytes Relative: 30.8 % (ref 12.0–46.0)
Lymphs Abs: 1.8 10*3/uL (ref 0.7–4.0)
MCHC: 33.7 g/dL (ref 30.0–36.0)
MCV: 95.6 fl (ref 78.0–100.0)
Monocytes Absolute: 0.5 10*3/uL (ref 0.1–1.0)
Monocytes Relative: 9.2 % (ref 3.0–12.0)
Neutro Abs: 3.2 10*3/uL (ref 1.4–7.7)
Neutrophils Relative %: 55.2 % (ref 43.0–77.0)
Platelets: 250 10*3/uL (ref 150.0–400.0)
RBC: 4.21 Mil/uL (ref 3.87–5.11)
RDW: 13.2 % (ref 11.5–15.5)
WBC: 5.7 10*3/uL (ref 4.0–10.5)

## 2021-07-11 LAB — LIPID PANEL
Cholesterol: 186 mg/dL (ref 0–200)
HDL: 69.3 mg/dL (ref >39.00)
LDL Cholesterol: 94 mg/dL (ref 0–99)
NonHDL: 116.49
Total CHOL/HDL Ratio: 3
Triglycerides: 114 mg/dL (ref 0.0–149.0)
VLDL: 22.8 mg/dL (ref 0.0–40.0)

## 2021-07-11 LAB — COMPREHENSIVE METABOLIC PANEL
ALT: 31 U/L (ref 0–35)
AST: 23 U/L (ref 0–37)
Albumin: 4.3 g/dL (ref 3.5–5.2)
Alkaline Phosphatase: 52 U/L (ref 39–117)
BUN: 16 mg/dL (ref 6–23)
CO2: 28 mEq/L (ref 19–32)
Calcium: 8.9 mg/dL (ref 8.4–10.5)
Chloride: 104 mEq/L (ref 96–112)
Creatinine, Ser: 0.69 mg/dL (ref 0.40–1.20)
GFR: 105.15 mL/min (ref >60.00)
Glucose, Bld: 86 mg/dL (ref 70–99)
Potassium: 4.2 mEq/L (ref 3.5–5.1)
Sodium: 136 mEq/L (ref 135–145)
Total Bilirubin: 0.4 mg/dL (ref 0.2–1.2)
Total Protein: 6.4 g/dL (ref 6.0–8.3)

## 2021-07-11 MED ORDER — PHENTERMINE HCL 37.5 MG PO TABS: 37.5000 mg | ORAL_TABLET | Freq: Every day | ORAL | 2 refills | Status: DC

## 2021-07-11 NOTE — Progress Notes (Signed)
? ?Subjective:  ?  ?Tracie Riley is a 46 y.o. female and is here for a comprehensive physical exam. ? ? ?HPI ? ?There are no preventive care reminders to display for this patient. ? ? ?Acute Concerns: ?Overweight ?States that although she is still interested in being referred to a dietician to see what food she is sensitive to, she would also like a boost to aid in her weight loss. Pt states that years ago she did trial phentermine and found this to not only assist with her weight loss, but as well other things such as meal prepping and attention span. At this time she is interested in restarting this medication if possible.  ? ?Chronic Issues: ?IBS ?Tracie Riley is currently compliant with taking protonix 40 mg daily and bentyl 20 mg twice daily as needed with no complications. States that she has ad to use her bentyl here and there, but believes this is caused by certain foods.  Although she is regularly following up with Tracie Riley, gastroenterology, she would like a referral to a dietician to help with this as well as her weight loss goals.  ? ?Tobacco Abuse ?As of today, pt admits that she is still smoking but has tried decreasing this bit by bit. At this time she is not interested in any assistance for smoking cessation. Denies CP or SOB.  ? ?Health Maintenance: ?Immunizations -- Covid- OVZ;8588 ?Influenza- Due;2020 ?Tdap- UTD;2016 ?Colonoscopy -- UTD;2021 ?Mammogram -- Due 12/2021;Last 12/2020 ?PAP -- FOY;7741 ?Bone Density -- N/A ?Dentistry- Will update soon ?Ophthalmology- Will update soon ?Diet -- Slowly eliminating certain groups such as dairy due to GI upset ?Sleep habits -- No concerns  ?Exercise -- As able ?Weight -- Stable ?Mood -- No concerns  ?Weight history: ?Wt Readings from Last 10 Encounters:  ?07/11/21 189 lb 6.1 oz (85.9 kg)  ?06/01/21 188 lb (85.3 kg)  ?01/31/21 179 lb 3.2 oz (81.3 kg)  ?07/04/20 183 lb 9.6 oz (83.3 kg)  ?04/13/20 201 lb (91.2 kg)  ?07/12/19 217 lb 6 oz (98.6 kg)  ?05/13/19  221 lb 12.8 oz (100.6 kg)  ?04/29/19 221 lb 12.8 oz (100.6 kg)  ?02/17/19 218 lb (98.9 kg)  ?02/17/19 218 lb 6.4 oz (99.1 kg)  ? ?Body mass index is 29.22 kg/m?Marland Kitchen ?Patient's last menstrual period was 06/13/2021 (approximate). ?Alcohol use:  reports current alcohol use of about 4.0 - 6.0 standard drinks per week. ?Tobacco use:  ?Tobacco Use: High Risk  ? Smoking Tobacco Use: Every Day  ? Smokeless Tobacco Use: Never  ? Passive Exposure: Not on file  ? ? ? ? ?  07/11/2021  ? 10:28 AM  ?Depression screen PHQ 2/9  ?Decreased Interest 0  ?Down, Depressed, Hopeless 0  ?PHQ - 2 Score 0  ? ? ? ?Other providers/specialists: ?Patient Care Team: ?Tracie Coke, PA as PCP - General (Physician Assistant)  ? ? ?PMHx, SurgHx, SocialHx, Medications, and Allergies were reviewed in the Visit Navigator and updated as appropriate.  ? ?Past Medical History:  ?Diagnosis Date  ? Asthma   ? only during pregnancy  ? Back pain  - chornic neck and back pain, sees ortho ([facility]), had neg rheum workup per her report 05/19/2014  ? Frequent headaches 05/19/2014  ? GERD (gastroesophageal reflux disease)   ? HSV infection   ? vaginal  ? ? ?History reviewed. No pertinent surgical history. ? ? ?Family History  ?Problem Relation Age of Onset  ? Rheum arthritis Mother   ? Hepatitis C Mother   ?  Alcohol abuse Mother   ? Cancer Mother   ?     lung, smoker  ? Alcohol abuse Father   ?     deceased  ? Colon cancer Neg Hx   ? Rectal cancer Neg Hx   ? Stomach cancer Neg Hx   ? ? ?Social History  ? ?Tobacco Use  ? Smoking status: Every Day  ?  Packs/day: 0.50  ?  Years: 22.00  ?  Pack years: 11.00  ?  Types: Cigarettes  ? Smokeless tobacco: Never  ?Vaping Use  ? Vaping Use: Never used  ?Substance Use Topics  ? Alcohol use: Yes  ?  Alcohol/week: 4.0 - 6.0 standard drinks  ?  Types: 4 - 6 Glasses of wine per week  ?  Comment: WINE ON THE WEEKEND  ? Drug use: No  ? ? ?Review of Systems:  ? ?Review of Systems  ?Constitutional:  Negative for chills, fever,  malaise/fatigue and weight loss.  ?HENT:  Negative for hearing loss, sinus pain and sore throat.   ?Respiratory:  Negative for cough and hemoptysis.   ?Cardiovascular:  Negative for chest pain, palpitations, leg swelling and PND.  ?Gastrointestinal:  Negative for abdominal pain, constipation, diarrhea, heartburn, nausea and vomiting.  ?Genitourinary:  Negative for dysuria, frequency and urgency.  ?Musculoskeletal:  Negative for back pain, myalgias and neck pain.  ?Skin:  Negative for itching and rash.  ?Neurological:  Negative for dizziness, tingling, seizures and headaches.  ?Endo/Heme/Allergies:  Negative for polydipsia.  ?Psychiatric/Behavioral:  Negative for depression. The patient is not nervous/anxious.   ? ?Objective:  ? ?BP 102/70 (BP Location: Left Arm, Patient Position: Sitting, Cuff Size: Normal)   Pulse 79   Temp 98 ?F (36.7 ?C) (Temporal)   Ht 5' 7.5" (1.715 m)   Wt 189 lb 6.1 oz (85.9 kg)   LMP 06/13/2021 (Approximate)   SpO2 99%   BMI 29.22 kg/m?  ?Body mass index is 29.22 kg/m?. ? ? ?General Appearance:    Alert, cooperative, no distress, appears stated age  ?Head:    Normocephalic, without obvious abnormality, atraumatic  ?Eyes:    PERRL, conjunctiva/corneas clear, EOM's intact, fundi  ?  benign, both eyes  ?Ears:    Normal TM's and external ear canals, both ears  ?Nose:   Nares normal, septum midline, mucosa normal, no drainage    or sinus tenderness  ?Throat:   Lips, mucosa, and tongue normal; teeth and gums normal  ?Neck:   Supple, symmetrical, trachea midline, no adenopathy;  ?  thyroid:  no enlargement/tenderness/nodules; no carotid ?  bruit or JVD  ?Back:     Symmetric, no curvature, ROM normal, no CVA tenderness  ?Lungs:     Clear to auscultation bilaterally, respirations unlabored  ?Chest Wall:    No tenderness or deformity  ? Heart:    Regular rate and rhythm, S1 and S2 normal, no murmur, rub or gallop  ?Breast Exam:    Deferred  ?Abdomen:     Soft, non-tender, bowel sounds active  all four quadrants,  ?  no masses, no organomegaly  ?Genitalia:    Deferred  ?Extremities:   Extremities normal, atraumatic, no cyanosis or edema  ?Pulses:   2+ and symmetric all extremities  ?Skin:   Skin color, texture, turgor normal, no rashes or lesions  ?Lymph nodes:   Cervical, supraclavicular, and axillary nodes normal  ?Neurologic:   CNII-XII intact, normal strength, sensation and reflexes  ?  throughout  ? ? ?Assessment/Plan:  ? ?  Routine physical examination ?Today patient counseled on age appropriate routine health concerns for screening and prevention, each reviewed and up to date or declined. Immunizations reviewed and up to date or declined. Labs ordered and reviewed. Risk factors for depression reviewed and negative. Hearing function and visual acuity are intact. ADLs screened and addressed as needed. Functional ability and level of safety reviewed and appropriate. Education, counseling and referrals performed based on assessed risks today. Patient provided with a copy of personalized plan for preventive services. ? ?Encounter for lipid screening for cardiovascular disease ?Update lipid panel, will start medication as indicated by results  ? ?Gastroesophageal reflux disease, unspecified whether esophagitis present ?Mgmt per GI  ?Stable with protonix 40 mg daily ? ?Tobacco use disorder ?Ongoing  ?Declines medication intervention at this time ? ?Irritable bowel syndrome, unspecified type ?Mgmt per GI  ?Stable with bentyl 20 mg twice daily as needed for spasms ?Placed referral to dietician per patient's request ? ? ?Patient Counseling: ?'[x]'$    Nutrition: Stressed importance of moderation in sodium/caffeine intake, saturated fat and cholesterol, caloric balance, sufficient intake of fresh fruits, vegetables, fiber, calcium, iron, and 1 mg of folate supplement per day (for females capable of pregnancy).  ?'[x]'$    Stressed the importance of regular exercise.   ?'[x]'$    Substance Abuse: Discussed cessation/primary  prevention of tobacco, alcohol, or other drug use; driving or other dangerous activities under the influence; availability of treatment for abuse.   ?'[x]'$    Injury prevention: Discussed safety belts, safety helmet

## 2021-07-11 NOTE — Patient Instructions (Signed)
It was great to see you! ? ?Please go to the lab for blood work.  ? ?Our office will call you with your results unless you have chosen to receive results via MyChart. ? ?If your blood work is normal we will follow-up each year for physicals and as scheduled for chronic medical problems. ? ?If anything is abnormal we will treat accordingly and get you in for a follow-up. ? ?Take care, ? ?Dariona Postma ?  ? ? ?

## 2021-10-08 ENCOUNTER — Ambulatory Visit: Payer: BC Managed Care – PPO | Admitting: Registered"

## 2021-12-10 ENCOUNTER — Encounter: Payer: Self-pay | Admitting: *Deleted

## 2021-12-10 ENCOUNTER — Encounter: Payer: 59 | Attending: Physician Assistant | Admitting: Registered"

## 2021-12-10 DIAGNOSIS — K580 Irritable bowel syndrome with diarrhea: Secondary | ICD-10-CM | POA: Insufficient documentation

## 2021-12-10 NOTE — Patient Instructions (Signed)
Read through the website: [email or web address]

## 2021-12-10 NOTE — Progress Notes (Unsigned)
Medical Nutrition Therapy  Appointment Start time:  1400  Appointment End time:  1500  Primary concerns today: wants to understand food and how to tell which ones are causing symptoms (bloating, diarrhea, gas) Referral diagnosis: K58.9 (ICD-10-CM) - Irritable bowel syndrome, unspecified type Preferred learning style: no preference indicated Learning readiness: ready, change in progress  NUTRITION ASSESSMENT  Anthropometrics   Body Composition Scale Date 12/10/2021  Current Body Weight 193.6lb  Total Body Fat % 35.9%  Visceral Fat   Healthy = 1-9   High = 10-14   Very High = 15+ 9  Fat-Free Mass % 64%   Total Body Water % 46.5%  Muscle-Mass lbs 33.5  BMI 30  Body Fat Displacement          Torso  lbs 43         Left Leg  lbs 8.6         Right Leg  lbs 8.6         Left Arm  lbs 4.3         Right Arm   lbs 4.3   Clinical Medical Hx: frequent headaches, lactose intolerant, IBS Medications: bentyl (for IBS), mirena, protonix, phentermine (doesn't use it all the time) Labs: A1c 5.6% Notable Signs/Symptoms: bloating, diarrhea, gas, postprandial somnolence  Lifestyle & Dietary Hx Pt states several years ago she was able to pinpoint several foods that were causing diarrhea and bloating. Symptoms resolved with limiting gluten, and avoiding dairy and eggs. Pt state lost weight, bloating resolved and felt much better.  Pt states went back to eating some convenience foods like pizza that make her feel bad.  Pt states she is also concerned that she gets very sleepy after eating and avoids eating much when at work. Pt states breakfast is only meal that doesn't make her tired.  Estimated daily fluid intake: (probably enough) oz Supplements: Vit B complex occasionally  Sleep: not assessed Stress / self-care: not assessed Current average weekly physical activity: not assessed  24-Hr Dietary Recall First Meal: sausage flatbread OR biscuit sausage or chicken, soda Snack: none Second  Meal: kind bar, lays chips, sweetart fushions Snack: none Third Meal: cube steak, mashed potatoes, rice a roni Snack: none Beverages: coke, dr pepper or cheerwine every morning, yeti full of water  NUTRITION DIAGNOSIS  NB-1.1 Food and nutrition-related knowledge deficit As related to food allergy vs intolerance.  As evidenced by limited understanding of FODMAPs and the role of carbohydrate vs gluten in wheat for sxs.  NUTRITION INTERVENTION  Nutrition education (E-1) on the following topics:  Food allergy vs Food intolerance Balanced eating  Handouts Provided Include  None - provided on-line resources for FODMAPs  Learning Style & Readiness for Change Teaching method utilized: Visual & Auditory  Demonstrated degree of understanding via: Teach Back  Barriers to learning/adherence to lifestyle change: none  Goals Established by Pt Many of your symptoms may be related to food intolerance of foods high in FODMAPs Read through the website: [email or web address] Consider downloading [employer] app Try avoiding the following high FODMAP foods to begin: Apples, onions, garlic, wheat, lactose Continue avoiding eggs due to diarrhea symptoms that appear you have an allergy Keep a symptom diary and return for follow-up visit  MONITORING & EVALUATION Dietary intake, weekly physical activity, and GI sxs in 4-6 weeks.

## 2021-12-11 DIAGNOSIS — K580 Irritable bowel syndrome with diarrhea: Secondary | ICD-10-CM | POA: Insufficient documentation

## 2022-01-17 ENCOUNTER — Encounter: Payer: Self-pay | Admitting: Physician Assistant

## 2022-01-28 ENCOUNTER — Ambulatory Visit: Payer: 59 | Admitting: Registered"

## 2022-02-02 ENCOUNTER — Other Ambulatory Visit: Payer: Self-pay | Admitting: Physician Assistant

## 2022-02-02 ENCOUNTER — Other Ambulatory Visit: Payer: Self-pay | Admitting: Nurse Practitioner

## 2022-02-04 NOTE — Telephone Encounter (Signed)
Pt requesting refill for Phentermine 37.5 mg. Last OV 06/2021.

## 2022-02-18 ENCOUNTER — Encounter: Payer: Self-pay | Admitting: Registered"

## 2022-02-18 ENCOUNTER — Encounter: Payer: 59 | Attending: Physician Assistant | Admitting: Registered"

## 2022-02-18 DIAGNOSIS — K580 Irritable bowel syndrome with diarrhea: Secondary | ICD-10-CM | POA: Diagnosis present

## 2022-02-18 NOTE — Patient Instructions (Addendum)
Stop drinking soda - may be the carbonation, sugar or a combination of both contributing to the bloating. Use the meal plan we reviewed to help eliminate high FODMAP foods. Meal prepping: find low FODMAP recipes that will work well for left overs. Use FODMAP app or website for recipes Meat, vegetable, rice Gluten free baked goods Look for wheat-free foods Look for egg substitutes for baking (pancakes, waffles, etc) (Real) Maple syrup instead of honey

## 2022-02-18 NOTE — Progress Notes (Signed)
Medical Nutrition Therapy  Appointment Start time:  [id number]  Appointment End time:  1510  Primary concerns today: wants to understand food and how to tell which ones are causing symptoms (bloating, diarrhea, gas) - continues Referral diagnosis: K58.9 (ICD-10-CM) - Irritable bowel syndrome, unspecified type Preferred learning style: no preference indicated Learning readiness: ready, change in progress  NUTRITION ASSESSMENT  Anthropometrics  Not assessed this visit  Clinical Pt states no changes to medical history Medical Hx: frequent headaches, lactose intolerant, IBS Medications: bentyl (for IBS), mirena, protonix, phentermine (doesn't use it all the time) Labs: A1c 5.6% Notable Signs/Symptoms: bloating, diarrhea, gas, postprandial somnolence  Lifestyle & Dietary Hx Pt states she has been very busy and not had a lot of time to make changes. However, she has been avoiding [place] and eating late. She has downloaded the FODMAP app but has not used it yet. Pt reports the sandwich she gets at [employer] is sourdough, but still gets bloated.  Pt states she has not been happy wit her weight gain over the last 6 weeks.  Pt reports continued tiredness after eating and eats small portions when at work. Pt reports breakfast (small) is only meal that doesn't make her tired, usually has 20 oz coke with breakfast.  Estimated daily fluid intake: (probably enough) oz Supplements: Vit B complex occasionally  Sleep: not assessed Stress / self-care: not assessed Current average weekly physical activity: not assessed  24-Hr Dietary Recall Not assessed this visit  NUTRITION DIAGNOSIS  NB-1.1 Food and nutrition-related knowledge deficit As related to food allergy vs intolerance.  As evidenced by limited understanding of FODMAPs and the role of carbohydrate vs gluten in wheat for sxs.  NUTRITION INTERVENTION  Nutrition education (E-1) on the following topics:  FODMAP meal planning FODMAP  app Allergy (protein) vs Intolerance (carbohydrate)  Handouts Provided Include   FODMAPs Meal plan  Learning Style & Readiness for Change Teaching method utilized: Visual & Auditory  Demonstrated degree of understanding via: Teach Back  Barriers to learning/adherence to lifestyle change: none  Goals Established by Pt Stop drinking soda - may be the carbonation, sugar or a combination of both contributing to the bloating. Use the meal plan we reviewed to help eliminate high FODMAP foods. Meal prepping: find low FODMAP recipes that will work well for left overs. Use FODMAP app or website for recipes Meat, vegetable, rice Gluten free baked goods Look for wheat-free foods Look for egg substitutes for baking (pancakes, waffles, etc) (Real) Maple syrup instead of honey  MONITORING & EVALUATION Dietary intake, weekly physical activity, and GI sxs in 4-6 weeks.

## 2022-02-28 ENCOUNTER — Encounter: Payer: Self-pay | Admitting: *Deleted

## 2022-03-21 ENCOUNTER — Other Ambulatory Visit: Payer: Self-pay | Admitting: Nurse Practitioner

## 2022-06-03 ENCOUNTER — Other Ambulatory Visit: Payer: Self-pay

## 2022-06-03 MED ORDER — VALACYCLOVIR HCL 1 G PO TABS: ORAL_TABLET | ORAL | 3 refills | Status: DC

## 2022-06-03 NOTE — Telephone Encounter (Signed)
Last AEX 06/01/2021  AEX scheduled 08/07/22

## 2022-07-31 NOTE — Progress Notes (Signed)
Subjective:    Tracie Riley is a 47 y.o. female and is here for a comprehensive physical exam.  HPI  Health Maintenance Due  Topic Date Due   MAMMOGRAM  12/30/2021    Acute Concerns: None  Chronic Issues: GERD and IBS Treated with pantoprazole 40 mg daily, dicyclomine 20 mg twice daily as needed. Sees gastroenterology and due for follow-up Fluctuates between constipation and loose stools at her baseline  Attention Difficulty Has been having trouble focusing. Reports this has been longstanding since age 22. Agreeable to ADHD screening.  Possible Sleep Apnea Having difficulty sleeping. She has been having poor sleep for some time. She has been snoring. Feels like she could have OSA - would like eval  Weight Loss Previously treated with phentermine. Still having problems with losing weight. Gained 7 pounds between 12/10/21 and today. Interested in weight loss medication trial.  Mirena IUD in place. Sees dermatologist regularly- denies new or concerning skin problems. Denies hemiparesis, unusual headaches, swelling in ankles.  Health Maintenance: Immunizations -- UTD on flu, tetanus vaccines.  Colonoscopy -- Last completed 05/13/19. Results showed three 6-7 mm polyps in rectum, descending colon. Examination otherwise normal. Pathology showed hyperplastic polyps. Recommended repeat in 2031. Mammogram -- Last completed 12/30/20. Showed fibrocystic changes, no evidence of malignancy. Recommended repeat in 2023. PAP -- Last completed 04/13/20. Negative for intraepithelial lesion or malignancy. Recommended repeat in 2025. Bone Density -- N/A Diet -- Tries to eat healthy. Exercise -- Does not exercise regularly. Walks occasionally at work.  Sleep habits -- Poor (see above). Mood -- Has been down lately, denies SI/HI  UTD with dentist? - UTD UTD with eye doctor? - Not UTD.  Weight history: Wt Readings from Last 10 Encounters:  08/07/22 200 lb (90.7 kg)  08/07/22  199 lb (90.3 kg)  12/10/21 193 lb 9.6 oz (87.8 kg)  07/11/21 189 lb 6.1 oz (85.9 kg)  06/01/21 188 lb (85.3 kg)  01/31/21 179 lb 3.2 oz (81.3 kg)  07/04/20 183 lb 9.6 oz (83.3 kg)  04/13/20 201 lb (91.2 kg)  07/12/19 217 lb 6 oz (98.6 kg)  05/13/19 221 lb 12.8 oz (100.6 kg)   Body mass index is 31.09 kg/m. Patient's last menstrual period was 07/24/2022.  Alcohol use:  reports that she does not currently use alcohol.  Tobacco use:  Tobacco Use: High Risk (08/07/2022)   Patient History    Smoking Tobacco Use: Every Day    Smokeless Tobacco Use: Never    Passive Exposure: Not on file   Eligible for lung cancer screening? no     08/07/2022    1:45 PM  Depression screen PHQ 2/9  Decreased Interest 0  Down, Depressed, Hopeless 0  PHQ - 2 Score 0  Altered sleeping 1  Tired, decreased energy 1  Change in appetite 0  Feeling bad or failure about yourself  0  Trouble concentrating 1  Moving slowly or fidgety/restless 0  Suicidal thoughts 0  PHQ-9 Score 3  Difficult doing work/chores Somewhat difficult     Other providers/specialists: Patient Care Team: Jarold Motto, [name] (Physician Assistant)    PMHx, SurgHx, SocialHx, Medications, and Allergies were reviewed in the Visit Navigator and updated as appropriate.   Past Medical History:  Diagnosis Date   Asthma    only during pregnancy   Back pain  - chornic neck and back pain, sees ortho ([facility]), had neg rheum workup per her report 05/19/2014   Frequent headaches 05/19/2014  GERD (gastroesophageal reflux disease)    HSV infection    vaginal    No past surgical history on file.   Family History  Problem Relation Age of Onset   Rheum arthritis Mother    Hepatitis C Mother    Alcohol abuse Mother    Cancer Mother        lung, smoker   Alcohol abuse Father        deceased   Colon cancer Neg Hx    Rectal cancer Neg Hx    Stomach cancer Neg Hx     Social History   Tobacco Use    Smoking status: Every Day    Packs/day: 0.50    Years: 22.00    Additional pack years: 0.00    Total pack years: 11.00    Types: Cigarettes   Smokeless tobacco: Never  Vaping Use   Vaping Use: Never used  Substance Use Topics   Alcohol use: Not Currently    Comment: WINE ON THE WEEKEND   Drug use: No    Review of Systems:   Review of Systems  Constitutional:  Negative for chills, fever, malaise/fatigue and weight loss.  HENT:  Negative for hearing loss, sinus pain and sore throat.   Respiratory:  Negative for cough, hemoptysis and shortness of breath.   Cardiovascular:  Negative for chest pain, palpitations, leg swelling and PND.  Gastrointestinal:  Positive for constipation and diarrhea. Negative for abdominal pain, heartburn, nausea and vomiting.  Genitourinary:  Negative for dysuria, frequency and urgency.  Musculoskeletal:  Negative for back pain, myalgias and neck pain.  Skin:  Negative for itching and rash.  Neurological:  Negative for dizziness, tingling, seizures and headaches.       (-) Hemiparesis  Endo/Heme/Allergies:  Negative for polydipsia.  Psychiatric/Behavioral:  Positive for depression. The patient is not nervous/anxious.     Objective:   BP 120/70 (BP Location: Left Arm, Patient Position: Sitting, Cuff Size: Normal)   Pulse 82   Temp 97.7 F (36.5 C) (Temporal)   Ht 5' 7.25" (1.708 m)   Wt 200 lb (90.7 kg)   LMP 07/24/2022 Comment: mirena inserted 07-30-18, sexually active  SpO2 98%   BMI 31.09 kg/m  Body mass index is 31.09 kg/m.   General Appearance:    Alert, cooperative, no distress, appears stated age  Head:    Normocephalic, without obvious abnormality, atraumatic  Eyes:    PERRL, conjunctiva/corneas clear, EOM's intact, fundi    benign, both eyes  Ears:    Normal TM's and external ear canals, both ears  Nose:   Nares normal, septum midline, mucosa normal, no drainage    or sinus tenderness  Throat:   Lips, mucosa, and tongue normal; teeth  and gums normal  Neck:   Supple, symmetrical, trachea midline, no adenopathy;    thyroid:  no enlargement/tenderness/nodules; no carotid   bruit or JVD  Back:     Symmetric, no curvature, ROM normal, no CVA tenderness  Lungs:     Clear to auscultation bilaterally, respirations unlabored  Chest Wall:    No tenderness or deformity   Heart:    Regular rate and rhythm, S1 and S2 normal, no murmur, rub or gallop  Breast Exam:    Deferred   Abdomen:     Soft, non-tender, bowel sounds active all four quadrants,    no masses, no organomegaly  Genitalia:    Deferred   Extremities:   Extremities normal, atraumatic, no cyanosis or edema  Pulses:   2+ and symmetric all extremities  Skin:   Skin color, texture, turgor normal, no rashes or lesions  Lymph nodes:   Cervical, supraclavicular, and axillary nodes normal  Neurologic:   CNII-XII intact, normal strength, sensation and reflexes    throughout    Assessment/Plan:   Routine physical examination Today patient counseled on age appropriate routine health concerns for screening and prevention, each reviewed and up to date or declined. Immunizations reviewed and up to date or declined. Labs ordered and reviewed. Risk factors for depression reviewed and negative. Hearing function and visual acuity are intact. ADLs screened and addressed as needed. Functional ability and level of safety reviewed and appropriate. Education, counseling and referrals performed based on assessed risks today. Patient provided with a copy of personalized plan for preventive services.  Obesity, unspecified classification, unspecified obesity type, unspecified whether serious comorbidity present Discussed medication options Recommend she call her insurance for further evaluation of covered medications -- such as Wegovy, Zepbound, Contrave She will let us know when we result her labs  Suspected sleep apnea Referral to [facility] per patient request  Tobacco use  disorder Encouraged reduction Consider wellbutrin  Attention or concentration deficit Referral for ADHD evaluation Recommend follow-up with [place] afterwards for further discussion and medication advice if indicated  Irritable bowel syndrome, unspecified type Overall controlled Management per GI   I,Alexander Ruley,acting as a scribe for [employer], PA.,have documented all relevant documentation on the behalf of Jarold Motto, PA,as directed by  Jarold Motto, PA while in the presence of Jarold Motto, [place].   I, Jarold Motto, [place], have reviewed all documentation for this visit. The documentation on 08/07/22 for the exam, diagnosis, procedures, and orders are all accurate and complete.    Jarold Motto, PA-C [facility]

## 2022-08-07 ENCOUNTER — Encounter: Payer: Self-pay | Admitting: Physician Assistant

## 2022-08-07 ENCOUNTER — Ambulatory Visit (INDEPENDENT_AMBULATORY_CARE_PROVIDER_SITE_OTHER): Payer: 59 | Admitting: Obstetrics & Gynecology

## 2022-08-07 ENCOUNTER — Other Ambulatory Visit (HOSPITAL_COMMUNITY)
Admission: RE | Admit: 2022-08-07 | Discharge: 2022-08-07 | Disposition: A | Discharge status: Home / Self Care | Payer: 59 | Source: Ambulatory Visit | Attending: Obstetrics & Gynecology | Admitting: Obstetrics & Gynecology

## 2022-08-07 ENCOUNTER — Ambulatory Visit (INDEPENDENT_AMBULATORY_CARE_PROVIDER_SITE_OTHER): Payer: 59 | Admitting: Physician Assistant

## 2022-08-07 ENCOUNTER — Encounter: Payer: Self-pay | Admitting: Obstetrics & Gynecology

## 2022-08-07 VITALS — BP 120/70 | HR 82 | Temp 97.7°F | Ht 67.25 in | Wt 200.0 lb

## 2022-08-07 VITALS — BP 112/70 | HR 86 | Ht 67.25 in | Wt 199.0 lb

## 2022-08-07 DIAGNOSIS — Z01419 Encounter for gynecological examination (general) (routine) without abnormal findings: Secondary | ICD-10-CM | POA: Insufficient documentation

## 2022-08-07 DIAGNOSIS — R41840 Attention and concentration deficit: Secondary | ICD-10-CM

## 2022-08-07 DIAGNOSIS — K589 Irritable bowel syndrome without diarrhea: Secondary | ICD-10-CM

## 2022-08-07 DIAGNOSIS — F17200 Nicotine dependence, unspecified, uncomplicated: Secondary | ICD-10-CM

## 2022-08-07 DIAGNOSIS — Z30431 Encounter for routine checking of intrauterine contraceptive device: Secondary | ICD-10-CM | POA: Diagnosis not present

## 2022-08-07 DIAGNOSIS — R29818 Other symptoms and signs involving the nervous system: Secondary | ICD-10-CM

## 2022-08-07 DIAGNOSIS — E669 Obesity, unspecified: Secondary | ICD-10-CM | POA: Diagnosis not present

## 2022-08-07 DIAGNOSIS — Z0000 Encounter for general adult medical examination without abnormal findings: Secondary | ICD-10-CM

## 2022-08-07 NOTE — Patient Instructions (Addendum)
It was great to see you!  Call your insurance to see if any of these medications are covered: Foy [place], Contrave -- let us know what they say  I will place referral for [facility] and ADHD evaluation  We will get blood work today  Please go to the lab for blood work.   Our office will call you with your results unless you have chosen to receive results via MyChart.  If your blood work is normal we will follow-up each year for physicals and as scheduled for chronic medical problems.  If anything is abnormal we will treat accordingly and get you in for a follow-up.  Take care,  Lelon Mast

## 2022-08-07 NOTE — Progress Notes (Signed)
Tracie Riley 05/16/1975 [id number]   History:    47 y.o. .[id number] Married.  Children 14 and 5 yo.   RP:  Established patient presenting for annual gyn exam    HPI: Well on Mirena IUD x 07/2018.  No abnormal bleeding.  No pelvic pain.  No pain with intercourse. Pap Neg 03/2020. Pap reflex today. Urine and bowel movements normal.  Breasts normal. Mammo 12/2020, Lt Breast US Benign. Will schedule mammo now. Body mass index 30.94.  Exercising regularly. Cigarette smoking 1/2 pack daily. Strongly recommended to decrease and quit.  Health labs with family physician.  [facility] 04/2019.   Past medical history,surgical history, family history and social history were all reviewed and documented in the EPIC chart.  Gynecologic History Patient's last menstrual period was 07/24/2022.  Obstetric History OB History  Gravida Para Term Preterm AB Living  3 2 2  0 1 2  SAB IAB Ectopic Multiple Live Births  1 0 0 0      # Outcome Date GA Lbr Len/2nd Weight Sex Delivery Anes PTL Lv  3 SAB           2 Term           1 Term              ROS: A ROS was performed and pertinent positives and negatives are included in the history. GENERAL: No fevers or chills. HEENT: No change in vision, no earache, sore throat or sinus congestion. NECK: No pain or stiffness. CARDIOVASCULAR: No chest pain or pressure. No palpitations. PULMONARY: No shortness of breath, cough or wheeze. GASTROINTESTINAL: No abdominal pain, nausea, vomiting or diarrhea, melena or bright red blood per rectum. GENITOURINARY: No urinary frequency, urgency, hesitancy or dysuria. MUSCULOSKELETAL: No joint or muscle pain, no back pain, no recent trauma. DERMATOLOGIC: No rash, no itching, no lesions. ENDOCRINE: No polyuria, polydipsia, no heat or cold intolerance. No recent change in weight. HEMATOLOGICAL: No anemia or easy bruising or bleeding. NEUROLOGIC: No headache, seizures, numbness, tingling or weakness. PSYCHIATRIC: No depression, no loss of  interest in normal activity or change in sleep pattern.     Exam:   BP 112/70   Pulse 86   Ht 5' 7.25" (1.708 m)   Wt 199 lb (90.3 kg)   LMP 07/24/2022 Comment: mirena inserted 07-30-18, sexually active  SpO2 97%   BMI 30.94 kg/m   Body mass index is 30.94 kg/m.  General appearance : Well developed well nourished female. No acute distress HEENT: Eyes: no retinal hemorrhage or exudates,  Neck supple, trachea midline, no carotid bruits, no thyroidmegaly Lungs: Clear to auscultation, no rhonchi or wheezes, or rib retractions  Heart: Regular rate and rhythm, no murmurs or gallops Breast:Examined in sitting and supine position were symmetrical in appearance, no palpable masses or tenderness,  no skin retraction, no nipple inversion, no nipple discharge, no skin discoloration, no axillary or supraclavicular lymphadenopathy Abdomen: no palpable masses or tenderness, no rebound or guarding Extremities: no edema or skin discoloration or tenderness  Pelvic: Vulva: Normal             Vagina: No gross lesions or discharge  Cervix: No gross lesions or discharge. IUD strings short, felt on bimanual exam.  Pap reflex done.  Uterus  AV, normal size, shape and consistency, non-tender and mobile  Adnexa  Without masses or tenderness  Anus: Normal   Assessment/Plan:  47 y.o. female for annual exam   1. Encounter for routine gynecological examination with  Papanicolaou smear of cervix Well on Mirena IUD x 07/2018.  No abnormal bleeding.  No pelvic pain.  No pain with intercourse. Pap Neg 03/2020. Pap reflex today. Urine and bowel movements normal.  Breasts normal. Mammo 12/2020, Lt Breast US Benign. Will schedule mammo now. Body mass index 30.94.  Exercising regularly. Cigarette smoking 1/2 pack daily. Strongly recommended to decrease and quit.  Health labs with family physician.  [facility] 04/2019. - Cytology - PAP( [place])  2. Encounter for routine checking of intrauterine contraceptive device  (IUD) Well on Mirena IUD x 07/2018.  No abnormal bleeding.  No pelvic pain.  No pain with intercourse. IUD strings short, felt on bimanual exam.   3. Tobacco use disorder Cigarette smoking 1/2 pack daily. Strongly recommended to decrease and quit.   Other orders - B Complex Vitamins (B COMPLEX PO); Take by mouth.   Genia Del MD, 9:00 AM

## 2022-08-08 LAB — CBC WITH DIFFERENTIAL/PLATELET
Basophils Absolute: 0 10*3/uL (ref 0.0–0.1)
Basophils Relative: 0.5 % (ref 0.0–3.0)
Eosinophils Absolute: 0.3 10*3/uL (ref 0.0–0.7)
Eosinophils Relative: 3.8 % (ref 0.0–5.0)
HCT: 43 % (ref 36.0–46.0)
Hemoglobin: 14.2 g/dL (ref 12.0–15.0)
Lymphocytes Relative: 23.4 % (ref 12.0–46.0)
Lymphs Abs: 1.8 10*3/uL (ref 0.7–4.0)
MCHC: 33.1 g/dL (ref 30.0–36.0)
MCV: 95.8 fl (ref 78.0–100.0)
Monocytes Absolute: 0.6 10*3/uL (ref 0.1–1.0)
Monocytes Relative: 7.7 % (ref 3.0–12.0)
Neutro Abs: 5 10*3/uL (ref 1.4–7.7)
Neutrophils Relative %: 64.6 % (ref 43.0–77.0)
Platelets: 280 10*3/uL (ref 150.0–400.0)
RBC: 4.48 Mil/uL (ref 3.87–5.11)
RDW: 13.4 % (ref 11.5–15.5)
WBC: 7.7 10*3/uL (ref 4.0–10.5)

## 2022-08-08 LAB — COMPREHENSIVE METABOLIC PANEL
ALT: 26 U/L (ref 0–35)
AST: 19 U/L (ref 0–37)
Albumin: 4.1 g/dL (ref 3.5–5.2)
Alkaline Phosphatase: 60 U/L (ref 39–117)
BUN: 16 mg/dL (ref 6–23)
CO2: 29 mEq/L (ref 19–32)
Calcium: 9.1 mg/dL (ref 8.4–10.5)
Chloride: 106 mEq/L (ref 96–112)
Creatinine, Ser: 0.8 mg/dL (ref 0.40–1.20)
GFR: 88.6 mL/min (ref >60.00)
Glucose, Bld: 115 mg/dL — ABNORMAL HIGH (ref 70–99)
Potassium: 3.9 mEq/L (ref 3.5–5.1)
Sodium: 141 mEq/L (ref 135–145)
Total Bilirubin: 0.4 mg/dL (ref 0.2–1.2)
Total Protein: 6.1 g/dL (ref 6.0–8.3)

## 2022-08-08 LAB — LIPID PANEL
Cholesterol: 195 mg/dL (ref 0–200)
HDL: 52.4 mg/dL (ref >39.00)
LDL Cholesterol: 105 mg/dL — ABNORMAL HIGH (ref 0–99)
NonHDL: 142.52
Total CHOL/HDL Ratio: 4
Triglycerides: 190 mg/dL — ABNORMAL HIGH (ref 0.0–149.0)
VLDL: 38 mg/dL (ref 0.0–40.0)

## 2022-08-08 LAB — HEMOGLOBIN A1C: Hgb A1c MFr Bld: 5.4 % (ref 4.6–6.5)

## 2022-08-09 NOTE — Telephone Encounter (Signed)
Please see patient response and advise 

## 2022-08-12 ENCOUNTER — Other Ambulatory Visit: Payer: Self-pay | Admitting: Physician Assistant

## 2022-08-12 MED ORDER — BUPROPION HCL ER (XL) 150 MG PO TB24: 150.0000 mg | ORAL_TABLET | Freq: Every day | ORAL | 1 refills | Status: DC

## 2022-08-12 MED ORDER — NALTREXONE HCL 50 MG PO TABS: 25.0000 mg | ORAL_TABLET | Freq: Every day | ORAL | 1 refills | Status: DC

## 2022-08-14 LAB — CYTOLOGY - PAP
Comment: NEGATIVE
Diagnosis: NEGATIVE
High risk HPV: NEGATIVE

## 2022-11-13 ENCOUNTER — Other Ambulatory Visit: Payer: Self-pay | Admitting: Physician Assistant

## 2022-11-20 DIAGNOSIS — G4733 Obstructive sleep apnea (adult) (pediatric): Secondary | ICD-10-CM | POA: Diagnosis not present

## 2022-12-09 DIAGNOSIS — G4733 Obstructive sleep apnea (adult) (pediatric): Secondary | ICD-10-CM | POA: Diagnosis not present

## 2023-01-23 DIAGNOSIS — G4733 Obstructive sleep apnea (adult) (pediatric): Secondary | ICD-10-CM | POA: Diagnosis not present

## 2023-06-12 ENCOUNTER — Other Ambulatory Visit: Payer: Self-pay | Admitting: Physician Assistant

## 2023-06-12 DIAGNOSIS — Z1231 Encounter for screening mammogram for malignant neoplasm of breast: Secondary | ICD-10-CM

## 2023-06-18 ENCOUNTER — Ambulatory Visit

## 2023-06-26 ENCOUNTER — Ambulatory Visit
Admission: RE | Admit: 2023-06-26 | Discharge: 2023-06-26 | Disposition: A | Discharge status: Home / Self Care | Source: Ambulatory Visit | Attending: Physician Assistant

## 2023-06-26 DIAGNOSIS — Z1231 Encounter for screening mammogram for malignant neoplasm of breast: Secondary | ICD-10-CM | POA: Diagnosis not present

## 2023-08-18 ENCOUNTER — Encounter: Payer: Self-pay | Admitting: Physician Assistant

## 2023-08-18 ENCOUNTER — Ambulatory Visit (INDEPENDENT_AMBULATORY_CARE_PROVIDER_SITE_OTHER): Admitting: Physician Assistant

## 2023-08-18 VITALS — BP 130/84 | HR 74 | Temp 97.7°F | Ht 67.25 in | Wt 171.0 lb

## 2023-08-18 DIAGNOSIS — Z0001 Encounter for general adult medical examination with abnormal findings: Secondary | ICD-10-CM

## 2023-08-18 DIAGNOSIS — F17200 Nicotine dependence, unspecified, uncomplicated: Secondary | ICD-10-CM

## 2023-08-18 DIAGNOSIS — Z136 Encounter for screening for cardiovascular disorders: Secondary | ICD-10-CM | POA: Diagnosis not present

## 2023-08-18 DIAGNOSIS — L659 Nonscarring hair loss, unspecified: Secondary | ICD-10-CM

## 2023-08-18 DIAGNOSIS — R32 Unspecified urinary incontinence: Secondary | ICD-10-CM

## 2023-08-18 DIAGNOSIS — Z13220 Encounter for screening for lipoid disorders: Secondary | ICD-10-CM

## 2023-08-18 DIAGNOSIS — E663 Overweight: Secondary | ICD-10-CM

## 2023-08-18 LAB — COMPREHENSIVE METABOLIC PANEL WITH GFR
ALT: 23 U/L (ref 0–35)
AST: 21 U/L (ref 0–37)
Albumin: 4.6 g/dL (ref 3.5–5.2)
Alkaline Phosphatase: 80 U/L (ref 39–117)
BUN: 15 mg/dL (ref 6–23)
CO2: 28 meq/L (ref 19–32)
Calcium: 9.5 mg/dL (ref 8.4–10.5)
Chloride: 100 meq/L (ref 96–112)
Creatinine, Ser: 0.94 mg/dL (ref 0.40–1.20)
GFR: 72.49 mL/min (ref >60.00)
Glucose, Bld: 88 mg/dL (ref 70–99)
Potassium: 3.6 meq/L (ref 3.5–5.1)
Sodium: 137 meq/L (ref 135–145)
Total Bilirubin: 0.5 mg/dL (ref 0.2–1.2)
Total Protein: 6.8 g/dL (ref 6.0–8.3)

## 2023-08-18 LAB — CBC WITH DIFFERENTIAL/PLATELET
Basophils Absolute: 0 10*3/uL (ref 0.0–0.1)
Basophils Relative: 0.5 % (ref 0.0–3.0)
Eosinophils Absolute: 0.1 10*3/uL (ref 0.0–0.7)
Eosinophils Relative: 2 % (ref 0.0–5.0)
HCT: 48.3 % — ABNORMAL HIGH (ref 36.0–46.0)
Hemoglobin: 16.2 g/dL — ABNORMAL HIGH (ref 12.0–15.0)
Lymphocytes Relative: 22.7 % (ref 12.0–46.0)
Lymphs Abs: 1.7 10*3/uL (ref 0.7–4.0)
MCHC: 33.5 g/dL (ref 30.0–36.0)
MCV: 97.7 fl (ref 78.0–100.0)
Monocytes Absolute: 0.7 10*3/uL (ref 0.1–1.0)
Monocytes Relative: 9.7 % (ref 3.0–12.0)
Neutro Abs: 4.9 10*3/uL (ref 1.4–7.7)
Neutrophils Relative %: 65.1 % (ref 43.0–77.0)
Platelets: 240 10*3/uL (ref 150.0–400.0)
RBC: 4.95 Mil/uL (ref 3.87–5.11)
RDW: 13.1 % (ref 11.5–15.5)
WBC: 7.5 10*3/uL (ref 4.0–10.5)

## 2023-08-18 LAB — IBC + FERRITIN
Ferritin: 105.3 ng/mL (ref 10.0–291.0)
Iron: 138 ug/dL (ref 42–145)
Saturation Ratios: 43 % (ref 20.0–50.0)
TIBC: 320.6 ug/dL (ref 250.0–450.0)
Transferrin: 229 mg/dL (ref 212.0–360.0)

## 2023-08-18 LAB — FOLLICLE STIMULATING HORMONE: FSH: 26.2 m[IU]/mL

## 2023-08-18 LAB — LIPID PANEL
Cholesterol: 173 mg/dL (ref 0–200)
HDL: 78.9 mg/dL (ref >39.00)
LDL Cholesterol: 64 mg/dL (ref 0–99)
NonHDL: 93.61
Total CHOL/HDL Ratio: 2
Triglycerides: 146 mg/dL (ref 0.0–149.0)
VLDL: 29.2 mg/dL (ref 0.0–40.0)

## 2023-08-18 LAB — TSH: TSH: 1.18 u[IU]/mL (ref 0.35–5.50)

## 2023-08-18 LAB — HEMOGLOBIN A1C: Hgb A1c MFr Bld: 5.4 % (ref 4.6–6.5)

## 2023-08-18 NOTE — Progress Notes (Signed)
 Subjective:    Tracie Riley is a 48 y.o. female and is here for a comprehensive physical exam.  HPI  Acute Concerns: Urinary incontinence Pt reports urinary incontinence and leakage throughout the day.  She states she has tried wearing pads but is interested in other potential options.  Has had multiple vaginal child deliveries.  Does have a hx of constipation. Denies any known vaginal prolapses.   Hair thinning Pt reports hair thinning and shedding.  In the last year she has had period cramps and/or occasional spotting with IUD.   Chronic Issues: GERD // Constipation Not currently managing with any medications; was previously on Omeprazole and Dicyclomine .  Reports constipation and has bowel movements every 2 days or so.  Colonoscopy UTD; see below.  Weight Management She has been working on weight loss.  Not on any meds currently.  Making healthier food choices.  Tries to exercise regularly and plans to tone her muscles.   Smoking cessation: Pt tried Chantix for 1 week, stopped due to fatigue and nausea.  She continues to smoke about 0.5 ppd.   Health Maintenance: Immunizations -- UTD.  Colonoscopy -- UTD, done 05/13/2019. Polyps found and resected. Otherwise normal. Repeat 10 yrs, next due 05/12/2029. Mammogram -- UTD, last done 06/26/2023. No mammographic evidence of malignancy.  PAP -- UTD, last done 08/07/22. Results were normal. Repeat 5 yeas, next due 08/07/2027.  Bone Density -- N/a Diet -- Overall healthy diet.  Exercise -- Working on regular exercise.   Sleep habits -- No concerns.  Mood -- Stable.   UTD with dentist? - Yes.  UTD with eye doctor? - No, needs updated prescription.   Weight history: Wt Readings from Last 10 Encounters:  08/18/23 171 lb (77.6 kg)  08/07/22 200 lb (90.7 kg)  08/07/22 199 lb (90.3 kg)  12/10/21 193 lb 9.6 oz (87.8 kg)  07/11/21 189 lb 6.1 oz (85.9 kg)  06/01/21 188 lb (85.3 kg)  01/31/21 179 lb 3.2 oz (81.3 kg)   07/04/20 183 lb 9.6 oz (83.3 kg)  04/13/20 201 lb (91.2 kg)  07/12/19 217 lb 6 oz (98.6 kg)   Body mass index is 26.58 kg/m. No LMP recorded. (Menstrual status: IUD).  Alcohol use:  reports that she does not currently use alcohol.  Tobacco use:  Tobacco Use: High Risk (08/18/2023)   Patient History    Smoking Tobacco Use: Every Day    Smokeless Tobacco Use: Never    Passive Exposure: Not on file   Eligible for lung cancer screening? no     08/18/2023    2:29 PM  Depression screen PHQ 2/9  Decreased Interest 0  Down, Depressed, Hopeless 0  PHQ - 2 Score 0     Other providers/specialists: Patient Care Team: Alexander Iba, [name] (Physician Assistant)    PMHx, SurgHx, SocialHx, Medications, and Allergies were reviewed in the Visit Navigator and updated as appropriate.   Past Medical History:  Diagnosis Date   Asthma    only during pregnancy   Back pain  - chornic neck and back pain, sees ortho ([facility]), had neg rheum workup per her report 05/19/2014   Frequent headaches 05/19/2014   GERD (gastroesophageal reflux disease)    HSV infection    vaginal    No past surgical history on file.   Family History  Problem Relation Age of Onset   Rheum arthritis Mother    Hepatitis C Mother    Alcohol abuse Mother  Cancer Mother        lung, smoker   Alcohol abuse Father        deceased   Colon cancer Neg Hx    Rectal cancer Neg Hx    Stomach cancer Neg Hx     Social History   Tobacco Use   Smoking status: Every Day    Current packs/day: 0.50    Average packs/day: 0.5 packs/day for 22.0 years (11.0 ttl pk-yrs)    Types: Cigarettes   Smokeless tobacco: Never  Vaping Use   Vaping status: Never Used  Substance Use Topics   Alcohol use: Not Currently    Comment: WINE ON THE WEEKEND   Drug use: No    Review of Systems:   Review of Systems  Constitutional:  Negative for chills, fever, malaise/fatigue and weight loss.  HENT:  Negative for  hearing loss, sinus pain and sore throat.   Respiratory:  Negative for cough and hemoptysis.   Cardiovascular:  Negative for chest pain, palpitations, leg swelling and PND.  Gastrointestinal:  Negative for abdominal pain, constipation, diarrhea, heartburn, nausea and vomiting.  Genitourinary:  Negative for dysuria, frequency and urgency.  Musculoskeletal:  Negative for back pain, myalgias and neck pain.  Skin:  Negative for itching and rash.  Neurological:  Negative for dizziness, tingling, seizures and headaches.  Endo/Heme/Allergies:  Negative for polydipsia.  Psychiatric/Behavioral:  Negative for depression. The patient is not nervous/anxious.       Objective:   BP 130/84 (BP Location: Left Arm, Patient Position: Sitting, Cuff Size: Normal)   Pulse 74   Temp 97.7 F (36.5 C) (Temporal)   Ht 5' 7.25" (1.708 m)   Wt 171 lb (77.6 kg)   SpO2 97%   BMI 26.58 kg/m  Body mass index is 26.58 kg/m.   General Appearance:    Alert, cooperative, no distress, appears stated age  Head:    Normocephalic, without obvious abnormality, atraumatic  Eyes:    PERRL, conjunctiva/corneas clear, EOM's intact, fundi    benign, both eyes  Ears:    Normal TM's and external ear canals, both ears  Nose:   Nares normal, septum midline, mucosa normal, no drainage    or sinus tenderness  Throat:   Lips, mucosa, and tongue normal; teeth and gums normal  Neck:   Supple, symmetrical, trachea midline, no adenopathy;    thyroid :  no enlargement/tenderness/nodules; no carotid   bruit or JVD  Back:     Symmetric, no curvature, ROM normal, no CVA tenderness  Lungs:     Clear to auscultation bilaterally, respirations unlabored  Chest Wall:    No tenderness or deformity   Heart:    Regular rate and rhythm, S1 and S2 normal, no murmur, rub or gallop  Breast Exam:    Deferred  Abdomen:     Soft, non-tender, bowel sounds active all four quadrants,    no masses, no organomegaly  Genitalia:    Deferred   Extremities:   Extremities normal, atraumatic, no cyanosis or edema  Pulses:   2+ and symmetric all extremities  Skin:   Skin color, texture, turgor normal, no rashes or lesions  Lymph nodes:   Cervical, supraclavicular, and axillary nodes normal  Neurologic:   CNII-XII intact, normal strength, sensation and reflexes    throughout    Assessment/Plan:   Encounter for general adult medical examination with abnormal findings Today patient counseled on age appropriate routine health concerns for screening and prevention, each reviewed and up to  date or declined. Immunizations reviewed and up to date or declined. Labs ordered and reviewed. Risk factors for depression reviewed and negative. Hearing function and visual acuity are intact. ADLs screened and addressed as needed. Functional ability and level of safety reviewed and appropriate. Education, counseling and referrals performed based on assessed risks today. Patient provided with a copy of personalized plan for preventive services.  Hair thinning Update blood work to rule out organic cause If unexplained by blood work, may consider as needed minoxidil vs dermatology referral  Tobacco use disorder Continue efforts at reduction Consider nicotine patches - declines  Encounter for lipid screening for cardiovascular disease Update lipid panel  Overweight Continue efforts at healthy lifestyle  Urinary incontinence, unspecified type Referral to pelvic floor physical therapy  Follow up with gynecology if medication desired or symptom(s) do not improve with physical therapy    I, Bernita Bristle, acting as a Neurosurgeon for Alexander Iba, [place]., have documented all relevant documentation on the behalf of Alexander Iba, [place], as directed by   while in the presence of Alexander Iba, [place].  I, Alexander Iba, [place], have reviewed all documentation for this visit. The documentation on 08/18/23 for the exam, diagnosis, procedures, and orders are all  accurate and complete.  Alexander Iba, PA-C [facility]

## 2023-08-19 ENCOUNTER — Ambulatory Visit: Payer: Self-pay | Admitting: Physician Assistant

## 2023-08-19 DIAGNOSIS — D582 Other hemoglobinopathies: Secondary | ICD-10-CM

## 2023-09-24 ENCOUNTER — Other Ambulatory Visit (INDEPENDENT_AMBULATORY_CARE_PROVIDER_SITE_OTHER)

## 2023-09-24 DIAGNOSIS — D582 Other hemoglobinopathies: Secondary | ICD-10-CM | POA: Diagnosis not present

## 2023-09-24 LAB — CBC WITH DIFFERENTIAL/PLATELET
Basophils Absolute: 0 K/uL (ref 0.0–0.1)
Basophils Relative: 0.8 % (ref 0.0–3.0)
Eosinophils Absolute: 0.2 K/uL (ref 0.0–0.7)
Eosinophils Relative: 2.6 % (ref 0.0–5.0)
HCT: 48 % — ABNORMAL HIGH (ref 36.0–46.0)
Hemoglobin: 16.4 g/dL — ABNORMAL HIGH (ref 12.0–15.0)
Lymphocytes Relative: 25.6 % (ref 12.0–46.0)
Lymphs Abs: 1.6 K/uL (ref 0.7–4.0)
MCHC: 34.1 g/dL (ref 30.0–36.0)
MCV: 96.8 fl (ref 78.0–100.0)
Monocytes Absolute: 0.5 K/uL (ref 0.1–1.0)
Monocytes Relative: 8.2 % (ref 3.0–12.0)
Neutro Abs: 4 K/uL (ref 1.4–7.7)
Neutrophils Relative %: 62.8 % (ref 43.0–77.0)
Platelets: 237 K/uL (ref 150.0–400.0)
RBC: 4.96 Mil/uL (ref 3.87–5.11)
RDW: 13.5 % (ref 11.5–15.5)
WBC: 6.4 K/uL (ref 4.0–10.5)

## 2023-09-25 ENCOUNTER — Ambulatory Visit: Payer: Self-pay | Admitting: Physician Assistant

## 2023-09-26 NOTE — Progress Notes (Signed)
 48 y.o. [id number] female smoker with Mirena  IUD (inserted 07/2018, short strings) here for annual exam. Single. Works in Agricultural engineer.  No LMP recorded. (Menstrual status: IUD).   She reports no other concerns. Lost 30lb over the past year with dietary modifications.  She does want to start working on toning. She feels she has a weak pelvic floor, does have some urinary leakage. Urine sample provided: No  Abnormal bleeding: none Pelvic discharge or pain: none Breast mass, nipple discharge or skin changes : none  Sexually active: Yes Birth control: IUD Mirena  Last PAP:     Component Value Date/Time   DIAGPAP  08/07/2022 0913    - Negative for intraepithelial lesion or malignancy (NILM)   HPVHIGH Negative 08/07/2022 0913   ADEQPAP  08/07/2022 0913    Satisfactory for evaluation; transformation zone component PRESENT.   Last mammogram: 06/26/23 Bi-Rads 1 Last colonoscopy: 05/13/19 10 year recall  Exercising: Not currently Smoker: Yes  Flowsheet Row Office Visit from 09/29/2023 in [facility]  PHQ-2 Total Score 0    Flowsheet Row Office Visit from 08/07/2022 in [facility] [place]  PHQ-9 Total Score 3     GYN HISTORY: No significant history  OB History  Gravida Para Term Preterm AB Living  3 2 2  0 1 2  SAB IAB Ectopic Multiple Live Births  1 0 0 0 2    # Outcome Date GA Lbr Len/2nd Weight Sex Type Anes PTL Lv  3 SAB           2 Term      Vag-Spont   LIV  1 Term      Vag-Spont   LIV   Past Medical History:  Diagnosis Date   Asthma    only during pregnancy   Back pain  - chornic neck and back pain, sees ortho ([facility]), had neg rheum workup per her report 05/19/2014   Frequent headaches 05/19/2014   GERD (gastroesophageal reflux disease)    HSV infection    vaginal   History reviewed. No pertinent surgical history. Current Outpatient Medications on File Prior to Visit  Medication Sig Dispense  Refill   B Complex Vitamins (B COMPLEX PO) Take by mouth.     levonorgestrel  (MIRENA ) 20 MCG/24HR IUD 1 each by Intrauterine route once.     No current facility-administered medications on file prior to visit.   Social History   Socioeconomic History   Marital status: Single    Spouse name: Not on file   Number of children: 2   Years of education: Not on file   Highest education level: Not on file  Occupational History   Not on file  Tobacco Use   Smoking status: Every Day    Current packs/day: 0.50    Average packs/day: 0.5 packs/day for 22.0 years (11.0 ttl pk-yrs)    Types: Cigarettes   Smokeless tobacco: Never  Vaping Use   Vaping status: Never Used  Substance and Sexual Activity   Alcohol use: Yes    Comment: WINE ON THE WEEKEND   Drug use: No   Sexual activity: Yes    Partners: Male    Birth control/protection: I.U.D.    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      Home Situation: lives with boyfriend, children -  20 and 12               Social Drivers of Corporate investment banker Strain: Not on file  Food Insecurity: No Food Insecurity (12/10/2021)   Hunger Vital Sign    Worried About Running Out of Food in the Last Year: Never true    Ran Out of Food in the Last Year: Never true  Transportation Needs: Not on file  Physical Activity: Not on file  Stress: Not on file  Social Connections: Not on file  Intimate Partner Violence: Not on file   Family History  Problem Relation Age of Onset   Rheum arthritis Mother    Hepatitis C Mother    Alcohol abuse Mother    Cancer Mother        lung, smoker   Alcohol abuse Father        deceased   Colon cancer Neg Hx    Rectal cancer Neg Hx    Stomach cancer Neg Hx    No Known Allergies   PE Today's Vitals   09/29/23 0949  BP: 118/60  Pulse: 88  Temp: 98.2 F (36.8 C)   TempSrc: Oral  SpO2: 98%  Weight: 162 lb (73.5 kg)  Height: 5' 8 (1.727 m)   Body mass index is 24.63 kg/m.  Physical Exam Vitals reviewed. Exam conducted with a chaperone present.  Constitutional:      General: She is not in acute distress.    Appearance: Normal appearance.  HENT:     Head: Normocephalic and atraumatic.     Nose: Nose normal.  Eyes:     Extraocular Movements: Extraocular movements intact.     Conjunctiva/sclera: Conjunctivae normal.  Neck:     Thyroid : No thyroid  mass, thyromegaly or thyroid  tenderness.  Pulmonary:     Effort: Pulmonary effort is normal.  Chest:     Chest wall: No mass or tenderness.  Breasts:    Right: Normal. No swelling, mass, nipple discharge, skin change or tenderness.     Left: Normal. No swelling, mass, nipple discharge, skin change or tenderness.  Abdominal:     General: There is no distension.     Palpations: Abdomen is soft.     Tenderness: There is no abdominal tenderness.  Genitourinary:    General: Normal vulva.     Exam position: Lithotomy position.     Urethra: No prolapse.     Vagina: Normal. No vaginal discharge or bleeding.     Cervix: Normal. No lesion.     Uterus: Normal. Not enlarged and not tender.      Adnexa: Right adnexa normal and left adnexa normal.     Comments: Kegel 2/5 Musculoskeletal:        General: Normal range of motion.     Cervical back: Normal range of motion.  Lymphadenopathy:     Upper Body:     Right upper body: No axillary adenopathy.     Left upper body: No axillary adenopathy.     Lower Body: No right inguinal adenopathy. No left inguinal adenopathy.  Skin:    General: Skin is warm and dry.  Neurological:     General: No focal deficit present.     Mental Status: She is alert.  Psychiatric:        Mood and Affect: Mood normal.        Behavior: Behavior normal.       Assessment and Plan:        Well  woman exam with routine gynecological exam Assessment & Plan: Cervical  cancer screening performed according to ASCCP guidelines. Encouraged annual mammogram screening Colonoscopy UTD DXA N/A Labs and immunizations with her primary Encouraged safe sexual practices as indicated Encouraged healthy lifestyle practices with diet and exercise, Regular pelvic floor exercise For patients under 50yo, I recommend 1000mg  calcium daily and 600IU of vitamin D daily.    Negative depression screening  Uses hormone releasing intrauterine device (IUD) for contraception Assessment & Plan: Continue for 8 years    Vera LULLA Pa, MD

## 2023-09-29 ENCOUNTER — Ambulatory Visit (INDEPENDENT_AMBULATORY_CARE_PROVIDER_SITE_OTHER): Payer: Self-pay | Admitting: Obstetrics and Gynecology

## 2023-09-29 ENCOUNTER — Encounter: Payer: Self-pay | Admitting: Obstetrics and Gynecology

## 2023-09-29 VITALS — BP 118/60 | HR 88 | Temp 98.2°F | Ht 68.0 in | Wt 162.0 lb

## 2023-09-29 DIAGNOSIS — Z1331 Encounter for screening for depression: Secondary | ICD-10-CM

## 2023-09-29 DIAGNOSIS — Z975 Presence of (intrauterine) contraceptive device: Secondary | ICD-10-CM | POA: Insufficient documentation

## 2023-09-29 DIAGNOSIS — Z01419 Encounter for gynecological examination (general) (routine) without abnormal findings: Secondary | ICD-10-CM | POA: Diagnosis not present

## 2023-09-29 NOTE — Assessment & Plan Note (Addendum)
 Cervical cancer screening performed according to ASCCP guidelines. Encouraged annual mammogram screening Colonoscopy UTD DXA N/A Labs and immunizations with her primary Encouraged safe sexual practices as indicated Encouraged healthy lifestyle practices with diet and exercise, Regular pelvic floor exercise For patients under 47yo, I recommend 1000mg  calcium daily and 600IU of vitamin D daily.

## 2023-09-29 NOTE — Patient Instructions (Signed)

## 2023-09-29 NOTE — Assessment & Plan Note (Signed)
 Continue for 8 years

## 2023-09-29 NOTE — Telephone Encounter (Signed)
 Patient has viewed results on MyChart. Last read by Cathryne CHRISTELLA Corn at 2:26PM on 09/27/2023.   Curtistine Quiet, CMA

## 2023-10-16 ENCOUNTER — Ambulatory Visit

## 2023-12-10 ENCOUNTER — Encounter: Payer: Self-pay | Admitting: Physician Assistant

## 2023-12-10 DIAGNOSIS — D582 Other hemoglobinopathies: Secondary | ICD-10-CM

## 2024-01-13 ENCOUNTER — Inpatient Hospital Stay: Admitting: Hematology and Oncology

## 2024-01-13 ENCOUNTER — Inpatient Hospital Stay

## 2024-01-20 ENCOUNTER — Encounter: Payer: Self-pay | Admitting: Hematology and Oncology

## 2024-01-20 ENCOUNTER — Inpatient Hospital Stay

## 2024-01-20 ENCOUNTER — Inpatient Hospital Stay: Attending: Hematology and Oncology | Admitting: Hematology and Oncology

## 2024-01-20 VITALS — BP 119/67 | HR 96 | Temp 99.6°F | Resp 18 | Ht 68.0 in | Wt 159.8 lb

## 2024-01-20 DIAGNOSIS — D751 Secondary polycythemia: Secondary | ICD-10-CM

## 2024-01-20 DIAGNOSIS — F17210 Nicotine dependence, cigarettes, uncomplicated: Secondary | ICD-10-CM | POA: Diagnosis not present

## 2024-01-20 DIAGNOSIS — Z801 Family history of malignant neoplasm of trachea, bronchus and lung: Secondary | ICD-10-CM | POA: Diagnosis not present

## 2024-01-20 DIAGNOSIS — F17200 Nicotine dependence, unspecified, uncomplicated: Secondary | ICD-10-CM

## 2024-01-20 LAB — CBC WITH DIFFERENTIAL (CANCER CENTER ONLY)
Abs Immature Granulocytes: 0.02 K/uL (ref 0.00–0.07)
Basophils Absolute: 0.1 K/uL (ref 0.0–0.1)
Basophils Relative: 1 %
Eosinophils Absolute: 0.2 K/uL (ref 0.0–0.5)
Eosinophils Relative: 2 %
HCT: 45.3 % (ref 36.0–46.0)
Hemoglobin: 15.4 g/dL — ABNORMAL HIGH (ref 12.0–15.0)
Immature Granulocytes: 0 %
Lymphocytes Relative: 22 %
Lymphs Abs: 1.7 K/uL (ref 0.7–4.0)
MCH: 33.2 pg (ref 26.0–34.0)
MCHC: 34 g/dL (ref 30.0–36.0)
MCV: 97.6 fL (ref 80.0–100.0)
Monocytes Absolute: 0.6 K/uL (ref 0.1–1.0)
Monocytes Relative: 8 %
Neutro Abs: 5.2 K/uL (ref 1.7–7.7)
Neutrophils Relative %: 67 %
Platelet Count: 272 K/uL (ref 150–400)
RBC: 4.64 MIL/uL (ref 3.87–5.11)
RDW: 12.9 % (ref 11.5–15.5)
WBC Count: 7.7 K/uL (ref 4.0–10.5)
nRBC: 0 % (ref 0.0–0.2)

## 2024-01-20 NOTE — Progress Notes (Signed)
 [facility] CONSULT NOTE  Patient Care Team: [name], [name] (Physician Assistant)  ASSESSMENT & PLAN Polycythemia, secondary The most likely cause of her recent abnormal CBC is secondary polycythemia from smoking Since her last blood draw, she has cut down on her cigarette smoking Repeat stat CBC today show improvement She does not need further workup of phlebotomy I anticipate further improvement or resolution once the patient is able to quit smoking  Tobacco use disorder We discussed importance of nicotine cessation She is attempting to try to quit smoking  Orders Placed This Encounter  Procedures   CBC with Differential (Cancer Center Only)    Standing Status:   Future    Number of Occurrences:   1    Expiration Date:   01/19/2025   Erythropoietin    Standing Status:   Future    Number of Occurrences:   1    Expiration Date:   01/19/2025    Almarie Bedford, MD 01/20/2024 2:49 PM  The total time spent in the appointment was 55 minutes encounter with patients including review of chart and various tests results, discussions about plan of care and coordination of care plan   All questions were answered. The patient knows to call the clinic with any problems, questions or concerns. No barriers to learning was detected.  Almarie Bedford, MD 11/4/20252:49 PM  CHIEF COMPLAINTS/PURPOSE OF CONSULTATION:  Erythrocytosis  HISTORY OF PRESENTING ILLNESS:  Tracie Riley 48 y.o. female is here because of elevated hemoglobin.  She was found to have abnormal CBC from recent blood work, that was ordered as part of her routine physical I have the opportunity to review his CBC dated back to 2010 She had blood count in 2010 when her youngest child was born, in fact, hemoglobin was low at 11.7 Between 20 19-20 22, her hemoglobin was normal On July 04, 2020, hemoglobin was borderline elevated at 15.6 In 2023 and 2024, hemoglobin was normal On August 18, 2023,  hemoglobin was 16.2 with hematocrit of 48.3 On September 24, 2023, hemoglobin was 16.4 with hematocrit of 48  She denies intermittent headaches, shortness of breath on exertion, frequent leg cramps and occasional chest pain.  She never suffer from diagnosis of blood clot.  There is no prior diagnosis of obstructive sleep apnea. The patient denies weight loss or skin itching.  The patient is a smoker and currently smokes 1/2 to 1 pack of cigarettes per day for the last 30 years.  She transiently quit when she was pregnant with her children for 6 to 9 months  MEDICAL HISTORY:  Past Medical History:  Diagnosis Date   Asthma    only during pregnancy   Back pain  - chornic neck and back pain, sees ortho ([facility]), had neg rheum workup per her report 05/19/2014   Frequent headaches 05/19/2014   GERD (gastroesophageal reflux disease)    HSV infection    vaginal    SURGICAL HISTORY: History reviewed. No pertinent surgical history.  SOCIAL HISTORY: Social History   Socioeconomic History   Marital status: Single    Spouse name: Not on file   Number of children: 2   Years of education: Not on file   Highest education level: Not on file  Occupational History   Not on file  Tobacco Use   Smoking status: Every Day    Current packs/day: 0.50    Average packs/day: 0.5 packs/day for 22.0 years (11.0 ttl pk-yrs)  Types: Cigarettes   Smokeless tobacco: Never  Vaping Use   Vaping status: Never Used  Substance and Sexual Activity   Alcohol use: Yes    Comment: WINE ON THE WEEKEND   Drug use: No   Sexual activity: Yes    Partners: Male    Birth control/protection: I.U.D.    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      Home Situation: lives with boyfriend, children - 20 and 12               Social Drivers of Corporate Investment Banker Strain: Not  on file  Food Insecurity: No Food Insecurity (01/20/2024)   Hunger Vital Sign    Worried About Running Out of Food in the Last Year: Never true    Ran Out of Food in the Last Year: Never true  Transportation Needs: No Transportation Needs (01/20/2024)   PRAPARE - Administrator, Civil Service (Medical): No    Lack of Transportation (Non-Medical): No  Physical Activity: Not on file  Stress: Not on file  Social Connections: Not on file  Intimate Partner Violence: Not At Risk (01/20/2024)   Humiliation, Afraid, Rape, and Kick questionnaire    Fear of Current or Ex-Partner: No    Emotionally Abused: No    Physically Abused: No    Sexually Abused: No    FAMILY HISTORY: Family History  Problem Relation Age of Onset   Rheum arthritis Mother    Hepatitis C Mother    Alcohol abuse Mother    Cancer Mother        lung, smoker   Alcohol abuse Father        deceased   Colon cancer Neg Hx    Rectal cancer Neg Hx    Stomach cancer Neg Hx     ALLERGIES:  has no known allergies.  MEDICATIONS:  Current Outpatient Medications  Medication Sig Dispense Refill   B Complex Vitamins (B COMPLEX PO) Take by mouth.     levonorgestrel  (MIRENA ) 20 MCG/24HR IUD 1 each by Intrauterine route once.     No current facility-administered medications for this visit.    REVIEW OF SYSTEMS:   Constitutional: Denies fevers, chills or abnormal night sweats Eyes: Denies blurriness of vision, double vision or watery eyes Ears, nose, mouth, throat, and face: Denies mucositis or sore throat Respiratory: Denies cough, dyspnea or wheezes Cardiovascular: Denies palpitation, chest discomfort or lower extremity swelling Gastrointestinal:  Denies nausea, heartburn or change in bowel habits Skin: Denies abnormal skin rashes Lymphatics: Denies new lymphadenopathy or easy bruising Neurological:Denies numbness, tingling or new weaknesses Behavioral/Psych: Mood is stable, no new changes  All other systems  were reviewed with the patient and are negative.  PHYSICAL EXAMINATION: ECOG PERFORMANCE STATUS: 0 - Asymptomatic  Vitals:   01/20/24 1402  BP: 119/67  Pulse: 96  Resp: 18  Temp: 99.6 F (37.6 C)  SpO2: 98%   Filed Weights   01/20/24 1402  Weight: 159 lb 12.8 oz (72.5 kg)    GENERAL:alert, no distress and comfortable SKIN: skin color, texture, turgor are normal, no rashes or significant lesions EYES: normal, conjunctiva are pink and non-injected, sclera clear OROPHARYNX:no exudate, no erythema and lips, buccal mucosa, and tongue normal  NECK: supple, thyroid  normal size, non-tender, without nodularity LYMPH:  no palpable lymphadenopathy in the  cervical, axillary or inguinal LUNGS: clear to auscultation and percussion with normal breathing effort HEART: regular rate & rhythm and no murmurs and no lower extremity edema ABDOMEN:abdomen soft, non-tender and normal bowel sounds Musculoskeletal:no cyanosis of digits and no clubbing  PSYCH: alert & oriented x 3 with fluent speech NEURO: no focal motor/sensory deficits  LABORATORY DATA:  I have reviewed the data as listed Recent Results (from the past 2160 hours)  CBC with Differential (Cancer Center Only)     Status: Abnormal   Collection Time: 01/20/24  2:20 PM  Result Value Ref Range   WBC Count 7.7 4.0 - 10.5 K/uL   RBC 4.64 3.87 - 5.11 MIL/uL   Hemoglobin 15.4 (H) 12.0 - 15.0 g/dL   HCT 54.6 63.9 - 53.9 %   MCV 97.6 80.0 - 100.0 fL   MCH 33.2 26.0 - 34.0 pg   MCHC 34.0 30.0 - 36.0 g/dL   RDW 87.0 88.4 - 84.4 %   Platelet Count 272 150 - 400 K/uL   nRBC 0.0 0.0 - 0.2 %   Neutrophils Relative % 67 %   Neutro Abs 5.2 1.7 - 7.7 K/uL   Lymphocytes Relative 22 %   Lymphs Abs 1.7 0.7 - 4.0 K/uL   Monocytes Relative 8 %   Monocytes Absolute 0.6 0.1 - 1.0 K/uL   Eosinophils Relative 2 %   Eosinophils Absolute 0.2 0.0 - 0.5 K/uL   Basophils Relative 1 %   Basophils Absolute 0.1 0.0 - 0.1 K/uL   Immature Granulocytes 0 %    Abs Immature Granulocytes 0.02 0.00 - 0.07 K/uL    Comment: Performed at [facility], [address] [place], [place] [address]

## 2024-01-20 NOTE — Assessment & Plan Note (Signed)
 The most likely cause of her recent abnormal CBC is secondary polycythemia from smoking Since her last blood draw, she has cut down on her cigarette smoking Repeat stat CBC today show improvement She does not need further workup of phlebotomy I anticipate further improvement or resolution once the patient is able to quit smoking

## 2024-01-20 NOTE — Assessment & Plan Note (Signed)
 We discussed importance of nicotine cessation She is attempting to try to quit smoking

## 2024-01-22 LAB — ERYTHROPOIETIN: Erythropoietin: 4.4 m[IU]/mL (ref 2.6–18.5)

## 2024-02-03 ENCOUNTER — Encounter: Payer: Self-pay | Admitting: Allergy and Immunology

## 2024-02-03 ENCOUNTER — Other Ambulatory Visit: Payer: Self-pay

## 2024-02-03 ENCOUNTER — Ambulatory Visit: Admitting: Allergy and Immunology

## 2024-02-03 VITALS — BP 102/60 | HR 79 | Temp 98.2°F | Resp 18 | Ht 67.0 in | Wt 163.6 lb

## 2024-02-03 DIAGNOSIS — T7819XD Other adverse food reactions, not elsewhere classified, subsequent encounter: Secondary | ICD-10-CM | POA: Diagnosis not present

## 2024-02-03 DIAGNOSIS — T7819XA Other adverse food reactions, not elsewhere classified, initial encounter: Secondary | ICD-10-CM

## 2024-02-03 NOTE — Progress Notes (Unsigned)
 [place] [facility] [place] [facility] [place] [facility] [place]   Dear Job,  Thank you for referring Tracie Riley to the [facility] [place]  on 02/03/2024.   Below is a summation of this patient's evaluation and recommendations.  Thank you for your referral. I will keep you informed about this patient's response to treatment.   If you have any questions please do not hesitate to contact me.   Sincerely,  Camellia DOROTHA Denis, MD Allergy / Immunology [facility]     ______________________________________________________________________    NEW PATIENT NOTE  Referring Provider: [name], PA Primary Provider: [name], PA Date of office visit: 02/03/2024    Subjective:   Chief Complaint:  Tracie Riley (DOB: [birth year]) is a 48 y.o. female who presents to the clinic on 02/03/2024 with a chief complaint of Allergy Testing (Patient is here to re-establish care. She would like to get a full extensive allergy test. ) .     HPI: Maytte presents to this clinic in evaluation of possible food hypersensitivity.  Clarita has a long history of irritable bowel syndrome that has been evaluated and treated by GI and she is actually has this under very good control.  However, if she eats egg of any type whether it be partially cooked or baked egg she will develop bloating and pain and diarrhea quickly.  There may also be an issue tied up with milk consumption.  She does use Lactaid if she eats a dairy meal but even with the Lactaid she sometimes develops some issues with her gut.  In addition to the specific food she just has a global problem with bloating and her gut making lots of noise after she eats.  She wonders if food is contributing to this issue.  Past Medical History:  Diagnosis Date   Asthma    only during pregnancy   Back pain  - chornic neck and back pain, sees ortho ([facility]), had neg  rheum workup per her report 05/19/2014   Frequent headaches 05/19/2014   GERD (gastroesophageal reflux disease)    HSV infection    vaginal    History reviewed. No pertinent surgical history.  Allergies as of 02/03/2024   No Known Allergies      Medication List    B COMPLEX PO Take by mouth.   levonorgestrel  20 MCG/24HR IUD Commonly known as: MIRENA  1 each by Intrauterine route once.    Review of systems negative except as noted in HPI / PMHx or noted below:  Review of Systems  Constitutional: Negative.   HENT: Negative.    Eyes: Negative.   Respiratory: Negative.    Cardiovascular: Negative.   Gastrointestinal: Negative.   Genitourinary: Negative.   Musculoskeletal: Negative.   Skin: Negative.   Neurological: Negative.   Endo/Heme/Allergies: Negative.   Psychiatric/Behavioral: Negative.      Family History  Problem Relation Age of Onset   Rheum arthritis Mother    Hepatitis C Mother    Alcohol abuse Mother    Cancer Mother        lung, smoker   Alcohol abuse Father        deceased   Colon cancer Neg Hx    Rectal cancer Neg Hx    Stomach cancer Neg Hx     Social History   Socioeconomic History   Marital status: Single    Spouse name: Not on file   Number of children:  2   Years of education: Not on file   Highest education level: Not on file  Occupational History   Not on file  Tobacco Use   Smoking status: Every Day    Current packs/day: 0.50    Average packs/day: 0.5 packs/day for 22.0 years (11.0 ttl pk-yrs)    Types: Cigarettes   Smokeless tobacco: Never  Vaping Use   Vaping status: Never Used  Substance and Sexual Activity   Alcohol use: Yes    Comment: WINE ON THE WEEKEND   Drug use: No   Sexual activity: Yes    Partners: Male    Birth control/protection: I.U.D.    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      Home Situation: lives with boyfriend, children - 20 and 12               Social Drivers of Corporate Investment Banker Strain: Not on file  Food Insecurity: No Food Insecurity (01/20/2024)   Hunger Vital Sign    Worried About Running Out of Food in the Last Year: Never true    Ran Out of Food in the Last Year: Never true  Transportation Needs: No Transportation Needs (01/20/2024)   PRAPARE - Administrator, Civil Service (Medical): No    Lack of Transportation (Non-Medical): No  Physical Activity: Not on file  Stress: Not on file  Social Connections: Not on file  Intimate Partner Violence: Not At Risk (01/20/2024)   Humiliation, Afraid, Rape, and Kick questionnaire    Fear of Current or Ex-Partner: No    Emotionally Abused: No    Physically Abused: No    Sexually Abused: No    Environmental and Social history  Lives in a townhouse with a dry environment, no animals located inside the household, carpet in the bedroom, no plastic in the bed, no plastic in the pillow, smoking cigarettes outside of the household,.  She works in an office setting.  Objective:   Vitals:   02/03/24 0948  BP: 102/60  Pulse: 79  Resp: 18  Temp: 98.2 F (36.8 C)  SpO2: 97%   Height: 5' 7 (170.2 cm) Weight: 163 lb 9.6 oz (74.2 kg)  Physical Exam Constitutional:      Appearance: She is not diaphoretic.  HENT:     Head: Normocephalic.     Right Ear: Tympanic membrane, ear canal and external ear normal.     Left Ear: Tympanic membrane, ear canal and external ear normal.     Nose: Nose normal. No mucosal edema or rhinorrhea.     Mouth/Throat:     Pharynx: Uvula midline. No oropharyngeal exudate.  Eyes:     Conjunctiva/sclera: Conjunctivae normal.  Neck:     Thyroid : No thyromegaly.     Trachea: Trachea normal. No tracheal tenderness or tracheal deviation.  Cardiovascular:     Rate and Rhythm: Normal rate and regular rhythm.     Heart sounds: Normal heart  sounds, S1 normal and S2 normal. No murmur heard. Pulmonary:     Effort: No respiratory distress.     Breath sounds: Normal breath sounds. No stridor. No wheezing or rales.  Lymphadenopathy:     Head:     Right side of head: No tonsillar adenopathy.     Left side of head: No tonsillar adenopathy.  Cervical: No cervical adenopathy.  Skin:    Findings: No erythema or rash.     Nails: There is no clubbing.  Neurological:     Mental Status: She is alert.     Diagnostics: Allergy skin tests were performed.   Spirometry was performed and demonstrated an FEV1 of *** @ *** % of predicted. FEV1/FVC = ***  The patient had an Asthma Control Test with the following results:  .     Assessment and Plan:    No diagnosis found.  Patient Instructions   1.   2.   3.   4.   5.   6.   7.   8.   Camellia DOROTHA Denis, MD Allergy / Immunology [facility] [place] 

## 2024-02-03 NOTE — Patient Instructions (Signed)
 SABRA

## 2024-02-04 ENCOUNTER — Encounter: Payer: Self-pay | Admitting: Allergy and Immunology

## 2024-02-17 ENCOUNTER — Ambulatory Visit: Admitting: Allergy and Immunology

## 2024-02-17 DIAGNOSIS — T7819XD Other adverse food reactions, not elsewhere classified, subsequent encounter: Secondary | ICD-10-CM | POA: Diagnosis not present

## 2024-02-17 DIAGNOSIS — T7819XA Other adverse food reactions, not elsewhere classified, initial encounter: Secondary | ICD-10-CM

## 2024-02-18 ENCOUNTER — Encounter: Payer: Self-pay | Admitting: Allergy and Immunology

## 2024-02-18 NOTE — Progress Notes (Signed)
 Mayola presents to this clinic to have skin testing performed.  Allergy skin testing did not identify any hypersensitivity against a screening panel of foods.

## 2024-03-19 ENCOUNTER — Other Ambulatory Visit: Payer: Self-pay

## 2024-03-19 DIAGNOSIS — A6004 Herpesviral vulvovaginitis: Secondary | ICD-10-CM

## 2024-03-19 NOTE — Telephone Encounter (Signed)
 Med refill request: Valacyclovir  tablets (1000 mg) Start:  07/30/2018 Disp:  15 tablets Refills:  2  Last AEX:  09/29/23 Next AEX:  Not yet scheduled Last MMG (if hormonal med):  N/A Refill authorized? Please Advise.

## 2024-03-22 ENCOUNTER — Other Ambulatory Visit: Payer: Self-pay

## 2024-03-22 DIAGNOSIS — A6004 Herpesviral vulvovaginitis: Secondary | ICD-10-CM | POA: Insufficient documentation

## 2024-03-22 MED ORDER — VALACYCLOVIR HCL 500 MG PO TABS: 500.0000 mg | ORAL_TABLET | Freq: Two times a day (BID) | ORAL | 3 refills | Status: AC

## 2024-03-31 ENCOUNTER — Ambulatory Visit: Admitting: Physician Assistant

## 2024-03-31 ENCOUNTER — Encounter: Payer: Self-pay | Admitting: Physician Assistant

## 2024-03-31 VITALS — BP 120/72 | HR 81 | Temp 97.9°F | Ht 67.0 in | Wt 162.0 lb

## 2024-03-31 DIAGNOSIS — R319 Hematuria, unspecified: Secondary | ICD-10-CM | POA: Diagnosis not present

## 2024-03-31 DIAGNOSIS — R350 Frequency of micturition: Secondary | ICD-10-CM

## 2024-03-31 LAB — URINALYSIS, MICROSCOPIC ONLY

## 2024-03-31 LAB — POCT URINALYSIS DIPSTICK
Bilirubin, UA: NEGATIVE
Blood, UA: POSITIVE
Glucose, UA: NEGATIVE
Ketones, UA: NEGATIVE
Leukocytes, UA: NEGATIVE
Nitrite, UA: NEGATIVE
Protein, UA: NEGATIVE
Spec Grav, UA: 1.015
Urobilinogen, UA: 0.2 U/dL
pH, UA: 6

## 2024-03-31 MED ORDER — CEPHALEXIN 500 MG PO CAPS: 500.0000 mg | ORAL_CAPSULE | Freq: Two times a day (BID) | ORAL | 0 refills | Status: AC

## 2024-03-31 NOTE — Progress Notes (Signed)
 "  History of Present Illness:   Chief Complaint  Patient presents with   Urinary Frequency    Pt c/o urinary frequency and urgency with urination since Sunday, pelvic pressure, slight low back pain. Denies fever.    Discussed the use of AI scribe software for clinical note transcription with the patient, who gave verbal consent to proceed.  History of Present Illness   Tracie Riley is a 49 year old female who presents with urinary frequency, urgency, and pressure.  She has had urinary frequency and urgency since Saturday, with symptoms clearly noticeable by Sunday. She feels bladder pressure after urination, as if it fills again immediately. She denies fever, chills, or other systemic symptoms and has not used over-the-counter medications. She notes slight low back pain she thinks is positional. She also notes occasional blood in the urine, often after intercourse. She uses a Mirena  IUD and has irregular bleeding at baseline. She smokes.        Past Medical History:  Diagnosis Date   Asthma    only during pregnancy   Back pain  - chornic neck and back pain, sees ortho ([facility]), had neg rheum workup per her report 05/19/2014   Frequent headaches 05/19/2014   GERD (gastroesophageal reflux disease)    HSV infection    vaginal     Social History[1]  No past surgical history on file.  Family History  Problem Relation Age of Onset   Rheum arthritis Mother    Hepatitis C Mother    Alcohol abuse Mother    Cancer Mother        lung, smoker   Alcohol abuse Father        deceased   Colon cancer Neg Hx    Rectal cancer Neg Hx    Stomach cancer Neg Hx     Allergies[2]  Current Medications:  Current Medications[3]   Review of Systems:   Negative unless otherwise specified per HPI.  Vitals:   Vitals:   03/31/24 1133  BP: 120/72  Pulse: 81  Temp: 97.9 F (36.6 C)  TempSrc: Temporal  SpO2: 98%  Weight: 162 lb (73.5 kg)  Height: 5' 7 (1.702 m)     Body mass  index is 25.37 kg/m.  Physical Exam:   Physical Exam Vitals and nursing note reviewed.  Constitutional:      General: She is not in acute distress.    Appearance: She is well-developed. She is not ill-appearing or toxic-appearing.  Cardiovascular:     Rate and Rhythm: Normal rate and regular rhythm.     Pulses: Normal pulses.     Heart sounds: Normal heart sounds, S1 normal and S2 normal.  Pulmonary:     Effort: Pulmonary effort is normal.     Breath sounds: Normal breath sounds.  Abdominal:     Tenderness: There is no right CVA tenderness or left CVA tenderness.  Skin:    General: Skin is warm and dry.  Neurological:     Mental Status: She is alert.     GCS: GCS eye subscore is 4. GCS verbal subscore is 5. GCS motor subscore is 6.  Psychiatric:        Speech: Speech normal.        Behavior: Behavior normal. Behavior is cooperative.    Results for orders placed or performed in visit on 03/31/24  POCT urinalysis dipstick  Result Value Ref Range   Color, UA Yellow    Clarity, UA Cloudy  Glucose, UA Negative Negative   Bilirubin, UA Negative    Ketones, UA Negative    Spec Grav, UA 1.015 1.010 - 1.025   Blood, UA Positive    pH, UA 6.0 5.0 - 8.0   Protein, UA Negative Negative   Urobilinogen, UA 0.2 0.2 or 1.0 E.U./dL   Nitrite, UA Negative    Leukocytes, UA Negative Negative   Appearance     Odor       Assessment and Plan:   Assessment and Plan    Urinary frequency; Hematuria, unspecified type   Differential includes UTI and nephrolithiasis. Discussed false positive potential in initial test and further evaluation if symptoms persist. Explained CT scan consideration if culture negative and symptoms persist. Advised antibiotic initiation if symptoms worsen. - Sent urine for culture and for micro to evaluate for true hematuria. - Prescribed antibiotic cephalexin  for potential UTI. - Advised to start antibiotic if symptoms worsen. - Encouraged increased fluid  intake. - Advised against prolonged use of over-the-counter Azo products. - Consider CT scan if culture negative and symptoms persist.    Lucie Buttner, PA-C     [1]  Social History Tobacco Use   Smoking status: Every Day    Current packs/day: 0.50    Average packs/day: 0.5 packs/day for 22.0 years (11.0 ttl pk-yrs)    Types: Cigarettes   Smokeless tobacco: Never  Vaping Use   Vaping status: Never Used  Substance Use Topics   Alcohol use: Yes    Comment: WINE ON THE WEEKEND   Drug use: No  [2] No Known Allergies [3]  Current Outpatient Medications:    B Complex Vitamins (B COMPLEX PO), Take by mouth., Disp: , Rfl:    cephALEXin  (KEFLEX ) 500 MG capsule, Take 1 capsule (500 mg total) by mouth 2 (two) times daily., Disp: 14 capsule, Rfl: 0   levonorgestrel  (MIRENA ) 20 MCG/24HR IUD, 1 each by Intrauterine route once., Disp: , Rfl:   "

## 2024-04-01 ENCOUNTER — Ambulatory Visit: Payer: Self-pay | Admitting: Physician Assistant

## 2024-04-02 ENCOUNTER — Telehealth: Payer: Self-pay | Admitting: *Deleted

## 2024-04-02 LAB — URINE CULTURE
MICRO NUMBER:: 17468202
SPECIMEN QUALITY:: ADEQUATE

## 2024-04-02 NOTE — Telephone Encounter (Signed)
 Copied from CRM [id number]. Topic: Clinical - Medical Advice >> Apr 02, 2024  2:32 PM Delon DASEN wrote: Reason for CRM: Patient asking if UTI is contagious to partner and how she got it, has many question [phone]  My chart message from patient regarding this question was sent to [facility]. She will respond thru My Chart.
